# Patient Record
Sex: Female | Born: 1980 | Race: White | Hispanic: Yes | Marital: Single | Smoking: Never smoker
Health system: Southern US, Community
[De-identification: ages and names within clinical notes are randomized; demographics above are authoritative.]

## PROBLEM LIST (undated history)

## (undated) ENCOUNTER — Inpatient Hospital Stay: Payer: Self-pay

## (undated) DIAGNOSIS — J45909 Unspecified asthma, uncomplicated: Secondary | ICD-10-CM

## (undated) DIAGNOSIS — O09529 Supervision of elderly multigravida, unspecified trimester: Secondary | ICD-10-CM

## (undated) HISTORY — PX: NO PAST SURGERIES: SHX2092

---

## 2004-09-20 ENCOUNTER — Inpatient Hospital Stay: Payer: Self-pay | Admitting: Obstetrics and Gynecology

## 2008-06-15 ENCOUNTER — Emergency Department: Payer: Self-pay | Admitting: Emergency Medicine

## 2008-09-22 ENCOUNTER — Ambulatory Visit: Payer: Self-pay | Admitting: Internal Medicine

## 2011-08-09 ENCOUNTER — Emergency Department: Payer: Self-pay | Admitting: Emergency Medicine

## 2013-10-03 ENCOUNTER — Emergency Department: Payer: Self-pay | Admitting: Emergency Medicine

## 2013-10-03 LAB — URINALYSIS, COMPLETE
BACTERIA: NONE SEEN
BILIRUBIN, UR: NEGATIVE
Blood: NEGATIVE
Glucose,UR: NEGATIVE mg/dL (ref 0–75)
Hyaline Cast: 1
Nitrite: NEGATIVE
Ph: 8 (ref 4.5–8.0)
RBC,UR: 4 /HPF (ref 0–5)
Specific Gravity: 1.026 (ref 1.003–1.030)
Squamous Epithelial: 22
WBC UR: 22 /HPF (ref 0–5)

## 2013-10-03 LAB — CBC
HCT: 38.4 % (ref 35.0–47.0)
HGB: 13.4 g/dL (ref 12.0–16.0)
MCH: 31.2 pg (ref 26.0–34.0)
MCHC: 34.9 g/dL (ref 32.0–36.0)
MCV: 89 fL (ref 80–100)
PLATELETS: 210 10*3/uL (ref 150–440)
RBC: 4.3 10*6/uL (ref 3.80–5.20)
RDW: 12.5 % (ref 11.5–14.5)
WBC: 6.1 10*3/uL (ref 3.6–11.0)

## 2013-10-03 LAB — BASIC METABOLIC PANEL
ANION GAP: 6 — AB (ref 7–16)
BUN: 6 mg/dL — ABNORMAL LOW (ref 7–18)
CO2: 30 mmol/L (ref 21–32)
Calcium, Total: 8.4 mg/dL — ABNORMAL LOW (ref 8.5–10.1)
Chloride: 101 mmol/L (ref 98–107)
Creatinine: 0.56 mg/dL — ABNORMAL LOW (ref 0.60–1.30)
Glucose: 89 mg/dL (ref 65–99)
Osmolality: 271 (ref 275–301)
POTASSIUM: 3.6 mmol/L (ref 3.5–5.1)
SODIUM: 137 mmol/L (ref 136–145)

## 2013-10-03 LAB — TROPONIN I: Troponin-I: <0.02 ng/mL

## 2015-08-15 DIAGNOSIS — O09529 Supervision of elderly multigravida, unspecified trimester: Secondary | ICD-10-CM

## 2015-08-15 HISTORY — DX: Supervision of elderly multigravida, unspecified trimester: O09.529

## 2016-01-25 LAB — OB RESULTS CONSOLE VARICELLA ZOSTER ANTIBODY, IGG: VARICELLA IGG: IMMUNE

## 2016-01-25 LAB — OB RESULTS CONSOLE RUBELLA ANTIBODY, IGM: Rubella: IMMUNE

## 2016-01-25 LAB — OB RESULTS CONSOLE HEPATITIS B SURFACE ANTIGEN: HEP B S AG: NEGATIVE

## 2016-01-25 LAB — OB RESULTS CONSOLE ANTIBODY SCREEN: ANTIBODY SCREEN: NEGATIVE

## 2016-01-25 LAB — OB RESULTS CONSOLE ABO/RH: RH Type: POSITIVE

## 2016-01-25 LAB — OB RESULTS CONSOLE RPR: RPR: NONREACTIVE

## 2016-01-25 LAB — OB RESULTS CONSOLE HIV ANTIBODY (ROUTINE TESTING): HIV: NONREACTIVE

## 2016-02-21 ENCOUNTER — Other Ambulatory Visit: Payer: Self-pay | Admitting: Family Medicine

## 2016-02-21 ENCOUNTER — Telehealth: Payer: Self-pay | Admitting: Obstetrics and Gynecology

## 2016-02-21 DIAGNOSIS — Z3481 Encounter for supervision of other normal pregnancy, first trimester: Secondary | ICD-10-CM

## 2016-02-21 DIAGNOSIS — O26849 Uterine size-date discrepancy, unspecified trimester: Secondary | ICD-10-CM

## 2016-02-22 ENCOUNTER — Ambulatory Visit
Admission: RE | Admit: 2016-02-22 | Discharge: 2016-02-22 | Disposition: A | Discharge status: Home / Self Care | Payer: 59 | Source: Ambulatory Visit | Attending: Family Medicine | Admitting: Family Medicine

## 2016-02-22 DIAGNOSIS — O26842 Uterine size-date discrepancy, second trimester: Secondary | ICD-10-CM | POA: Insufficient documentation

## 2016-02-22 DIAGNOSIS — O26849 Uterine size-date discrepancy, unspecified trimester: Secondary | ICD-10-CM

## 2016-02-22 DIAGNOSIS — Z3A15 15 weeks gestation of pregnancy: Secondary | ICD-10-CM | POA: Diagnosis not present

## 2016-02-22 DIAGNOSIS — Z3481 Encounter for supervision of other normal pregnancy, first trimester: Secondary | ICD-10-CM

## 2016-02-24 ENCOUNTER — Ambulatory Visit
Admission: RE | Admit: 2016-02-24 | Discharge: 2016-02-24 | Disposition: A | Discharge status: Home / Self Care | Payer: 59 | Source: Ambulatory Visit | Attending: Maternal & Fetal Medicine | Admitting: Maternal & Fetal Medicine

## 2016-02-24 VITALS — BP 115/67 | HR 81 | Temp 98.2°F | Ht 62.0 in | Wt 165.0 lb

## 2016-02-24 DIAGNOSIS — Z3A00 Weeks of gestation of pregnancy not specified: Secondary | ICD-10-CM | POA: Diagnosis not present

## 2016-02-24 DIAGNOSIS — O09512 Supervision of elderly primigravida, second trimester: Secondary | ICD-10-CM

## 2016-02-24 DIAGNOSIS — Z1379 Encounter for other screening for genetic and chromosomal anomalies: Secondary | ICD-10-CM | POA: Diagnosis not present

## 2016-02-24 HISTORY — DX: Supervision of elderly multigravida, unspecified trimester: O09.529

## 2016-02-24 NOTE — Progress Notes (Signed)
Referring Provider:   [facility] Length of Consultation: 60 minutes  Ms. Katie Tate was [facility] for genetic counseling because of advanced maternal age.  The patient will be 35 years old at the time of delivery.  This note summarizes the information we discussed with the aid of a Spanish interpreter.    We explained that the chance of a chromosome abnormality increases with maternal age.  Chromosomes and examples of chromosome problems were reviewed.  Humans typically have 46 chromosomes in each cell, with half passed through each sperm and egg.  Any change in the number or structure of chromosomes can increase the risk of problems in the physical and mental development of a pregnancy.   Based upon age of the patient, the chance of any chromosome abnormality was 1 in 98141. The chance of Down syndrome, the most common chromosome problem associated with maternal age, was 1 in 13256.  The risk of chromosome problems is in addition to the 3% general population risk for birth defects and mental retardation.  The greatest chance, of course, is that the baby would be born in good health.  We discussed the following prenatal screening and testing options for this pregnancy:  Maternal serum marker screening, a blood test that measures pregnancy proteins, can provide risk assessments for Down syndrome, trisomy 18, and open neural tube defects (spina bifida, anencephaly). Because it does not directly examine the fetus, it cannot positively diagnose or rule out these problems.  Targeted ultrasound uses high frequency sound waves to create an image of the developing fetus.  An ultrasound is often recommended as a routine means of evaluating the pregnancy.  It is also used to screen for fetal anatomy problems (for example, a heart defect) that might be suggestive of a chromosomal or other abnormality.   Amniocentesis involves the removal of a small amount  of amniotic fluid from the sac surrounding the fetus with the use of a thin needle inserted through the maternal abdomen and uterus.  Ultrasound guidance is used throughout the procedure.  Fetal cells from amniotic fluid are directly evaluated and > 99.5% of chromosome problems and > 98% of open neural tube defects can be detected. This procedure is generally performed after the 15th week of pregnancy.  The main risks to this procedure include complications leading to miscarriage in less than 1 in 200 cases (0.5%).  We also reviewed the availability of cell free fetal DNA testing from maternal blood to determine whether or not the baby may have either Down syndrome, trisomy 4813, or trisomy 3818.  This test utilizes a maternal blood sample and DNA sequencing technology to isolate circulating cell free fetal DNA from maternal plasma.  The fetal DNA can then be analyzed for DNA sequences that are derived from the three most common chromosomes involved in aneuploidy, chromosomes 13, 18, and 21.  If the overall amount of DNA is greater than the expected level for any of these chromosomes, aneuploidy is suspected.  While we do not consider it a replacement for invasive testing and karyotype analysis, a negative result from this testing would be reassuring, though not a guarantee of a normal chromosome complement for the baby.  An abnormal result is certainly suggestive of an abnormal chromosome complement, though we would still recommend amniocentesis to confirm any findings from this testing.  Cystic Fibrosis and Spinal Muscular Atrophy (SMA) screening were also discussed with the patient. Both conditions are recessive, which means  that both parents must be carriers in order to have a child with the disease.  Cystic fibrosis (CF) is one of the most common genetic conditions in persons of Caucasian ancestry.  This condition occurs in approximately 1 in 2,500 Caucasian persons and results in thickened secretions in the  lungs, digestive, and reproductive systems.  For a baby to be at risk for having CF, both of the parents must be carriers for this condition.  Approximately 1 in 47 Caucasian persons is a carrier for CF.  Current carrier testing looks for the most common mutations in the gene for CF and can detect approximately 90% of carriers in the Caucasian population.  This means that the carrier screening can greatly reduce, but cannot eliminate, the chance for an individual to have a child with CF.  If an individual is found to be a carrier for CF, then carrier testing would be available for the partner. As part of [place]'s newborn screening profile, all babies born in the state of [place] will have a two-tier screening process.  Specimens are first tested to determine the concentration of immunoreactive trypsinogen (IRT).  The top 5% of specimens with the highest IRT values then undergo DNA testing using a panel of over 40 common CF mutations. SMA is a neurodegenerative disorder that leads to atrophy of skeletal muscle and overall weakness.  This condition is also more prevalent in the Caucasian population, with 1 in 40-1 in 60 persons being a carrier and 1 in 6,000-1 in 10,000 children being affected.  There are multiple forms of the disease, with some causing death in infancy to other forms with survival into adulthood.  The genetics of SMA is complex, but carrier screening can detect up to 95% of carriers in the Caucasian population.  Similar to CF, a negative result can greatly reduce, but cannot eliminate, the chance to have a child with SMA.  The chance for these conditions is less in persons of Hispanic ancestry.  We obtained a detailed family history and pregnancy history.  The patient stated that her children, ages 57 and 60, both have hypertension, as does her father.  We reviewed that high blood pressure is most often the result of a combination of lifestyle factors and genetic factors.  The  specific genetic factors are not well understood.  We encourage the family to be mindful of diet, exercise and lifestyle modifications to help reduce blood pressure.  The remainder of the family history is unremarkable for birth defects, mental retardation, recurrent pregnancy loss or known chromosome abnormalities.  Ms. Rowan Pollman stated that this is her third pregnancy with her husband.  She reported no complications or exposures in this pregnancy that would be expected to increase the risk for birth defects.  After consideration of the options, Ms. Leiloni Smithers elected to proceed with InformaSeq testing and CF carrier screening.  After checking with insurance, we learned that they could not guarantee coverage for SMA carrier screening prior to testing.  The patient therefore declined that screening test.  The patient was scheduled to return in 3 weeks for a detailed anatomy ultrasound.  Ms. Maeby Vankleeck was encouraged to call with questions or concerns.  We can be contacted at [phone].   Cherly Anderson, MS, CGC

## 2016-02-24 NOTE — Progress Notes (Signed)
History reviewed.  Agree with recommendations outlined in [facility]'s note.

## 2016-03-01 LAB — CYSTIC FIBROSIS GENE TEST

## 2016-03-02 LAB — INFORMASEQ(SM) WITH XY ANALYSIS
FETAL FRACTION (%): 7.1
Fetal Number: 1
GESTATIONAL AGE AT COLLECTION: 15.3 wk
Weight: 165 [lb_av]

## 2016-03-06 ENCOUNTER — Telehealth: Payer: Self-pay | Admitting: Obstetrics and Gynecology

## 2016-03-06 NOTE — Telephone Encounter (Signed)
Ms. Katie Tate elected to have cell free fetal DNA testing through InformaSeq and CF carrier screening on 02/24/2016.  Results were communicated to the patient today with the aid of a Spanish interpreter.    The patient was informed of the results of her recent InformaSeq testing (performed at [facility]) which yielded NEGATIVE results.  The patient's specimen showed DNA consistent with two copies of chromosomes 21, 18 and 13.  The sensitivity for trisomy 73, trisomy 46 and trisomy 84 using this testing are reported as 99.1%, 98.3% and 98.1% respectively.  Thus, while the results of this testing are highly accurate, they are not considered diagnostic at this time.  Should more definitive information be desired, the patient may still consider amniocentesis.   As requested to know by the patient, sex chromosome analysis was included for this sample.  Results was consistent with a female fetus (XY). This is predicted with >97% accuracy.  A maternal serum AFP only should be considered if screening for neural tube defects is desired.  In addition, CF results are available.  CF is a genetic condition that occurs most often in Caucasian persons.  It primarily affects the lungs, digestive, and reproductive systems.  For someone to be at risk for having CF, both of their parents must be carriers for CF.  The testing can detect many persons who are carriers for CF and therefore determine if the pregnancy is at an increased risk for this condition.    The blood test results were negative when examined for the 32 most common mutations (or changes) in the gene for CF.  This means that she does not carry any of the most common changes in this gene.  Testing for these 32 mutations detects approximately 73% of carriers who are Hispanic.  Therefore, the chance that she is a carrier based on this negative result has been reduced from 1 in 46 to approximately 1 in 168.  Because this testing cannot detect all changes that may  cause CF, we cannot eliminate the chance that this individual is a carrier completely.  This dramatically reduces her chance to have a child with CF.  If there are any questions or concerns, please feel free to contact our office at [phone].     Cherly Anderson, MS, CGC

## 2016-03-16 ENCOUNTER — Other Ambulatory Visit: Payer: Self-pay

## 2016-03-16 ENCOUNTER — Ambulatory Visit
Admission: RE | Admit: 2016-03-16 | Discharge: 2016-03-16 | Disposition: A | Discharge status: Home / Self Care | Payer: 59 | Source: Ambulatory Visit | Attending: Obstetrics & Gynecology | Admitting: Obstetrics & Gynecology

## 2016-03-16 DIAGNOSIS — Z3A18 18 weeks gestation of pregnancy: Secondary | ICD-10-CM | POA: Diagnosis not present

## 2016-03-16 DIAGNOSIS — O09512 Supervision of elderly primigravida, second trimester: Secondary | ICD-10-CM

## 2016-03-16 DIAGNOSIS — O09522 Supervision of elderly multigravida, second trimester: Secondary | ICD-10-CM

## 2016-03-27 NOTE — Progress Notes (Signed)
Agree with assessment and plan as outlined in [facility] Wells's note.   

## 2016-04-05 ENCOUNTER — Other Ambulatory Visit: Payer: Self-pay

## 2016-04-05 DIAGNOSIS — O09522 Supervision of elderly multigravida, second trimester: Secondary | ICD-10-CM

## 2016-04-06 ENCOUNTER — Ambulatory Visit
Admission: RE | Admit: 2016-04-06 | Discharge: 2016-04-06 | Disposition: A | Discharge status: Home / Self Care | Payer: 59 | Source: Ambulatory Visit | Attending: Obstetrics & Gynecology | Admitting: Obstetrics & Gynecology

## 2016-04-06 DIAGNOSIS — O09522 Supervision of elderly multigravida, second trimester: Secondary | ICD-10-CM | POA: Diagnosis present

## 2016-04-06 DIAGNOSIS — O321XX0 Maternal care for breech presentation, not applicable or unspecified: Secondary | ICD-10-CM | POA: Diagnosis not present

## 2016-04-06 DIAGNOSIS — Z3A21 21 weeks gestation of pregnancy: Secondary | ICD-10-CM | POA: Insufficient documentation

## 2016-04-06 DIAGNOSIS — O09512 Supervision of elderly primigravida, second trimester: Secondary | ICD-10-CM

## 2016-04-24 NOTE — Telephone Encounter (Signed)
Note opened in error.

## 2016-07-16 LAB — OB RESULTS CONSOLE GBS: GBS: POSITIVE

## 2016-07-17 LAB — OB RESULTS CONSOLE GC/CHLAMYDIA
Chlamydia: NEGATIVE
Gonorrhea: NEGATIVE

## 2016-08-14 NOTE — L&D Delivery Note (Addendum)
VAGINAL DELIVERY NOTE:  Date of Delivery: 08/22/2016 [facility] Gestational Age/EDD: 8076w0d 08/15/2016, by Last Menstrual Period Antepartum complications: GBS positive, obesity, varicella non-immune, anemia, dental caries, asthma, AMA  Attending Physician: M.Teagyn Fishel, CNM/Harris Delivery Type:NSVD Anesthesia:Epidural for repair  Laceration: 1st degree  Episiotomy: None Placenta: SDOP intact - meconium stained  Intrapartum complications: Recurrent variable decels throughout the night, with recovery with IVF bolus, oxygen by facemask, and terbutaline.  Decreased variability with recovery after O2, elevated BPs, proteinuria 240 Estimated Blood Loss: 350mL GBS: Pos with antibiotics throughout labor  Procedure Details: Pusing with UC's and vtx delivered, NO CAN noted, Ant and post shoulder and body delivered at 0814 and to mom's abd, terminal meconium noted, delayed cord clamping, CCx2 and cut. SDOP intact at 0819. FF and lochia mod. 1st degree repair done with 2.0 and 3.0 vicryl    Baby: Liveborn Female, Apgars 8,9, weight pending, baby named "Christian"    [place]eredith Hassan [place], [place]M

## 2016-08-16 ENCOUNTER — Encounter: Payer: Self-pay | Admitting: *Deleted

## 2016-08-16 ENCOUNTER — Observation Stay
Admission: EM | Admit: 2016-08-16 | Discharge: 2016-08-16 | Disposition: A | Discharge status: Home / Self Care | Payer: Managed Care, Other (non HMO) | Attending: Obstetrics and Gynecology | Admitting: Obstetrics and Gynecology

## 2016-08-16 DIAGNOSIS — O479 False labor, unspecified: Secondary | ICD-10-CM | POA: Diagnosis present

## 2016-08-16 DIAGNOSIS — Z3483 Encounter for supervision of other normal pregnancy, third trimester: Principal | ICD-10-CM | POA: Insufficient documentation

## 2016-08-16 DIAGNOSIS — Z3A40 40 weeks gestation of pregnancy: Secondary | ICD-10-CM | POA: Insufficient documentation

## 2016-08-16 DIAGNOSIS — O09512 Supervision of elderly primigravida, second trimester: Secondary | ICD-10-CM

## 2016-08-16 LAB — COMPREHENSIVE METABOLIC PANEL
ALK PHOS: 193 U/L — AB (ref 38–126)
ALT: 20 U/L (ref 14–54)
AST: 18 U/L (ref 15–41)
Albumin: 2.8 g/dL — ABNORMAL LOW (ref 3.5–5.0)
Anion gap: 7 (ref 5–15)
BUN: 9 mg/dL (ref 6–20)
CALCIUM: 8.6 mg/dL — AB (ref 8.9–10.3)
CO2: 22 mmol/L (ref 22–32)
CREATININE: 0.58 mg/dL (ref 0.44–1.00)
Chloride: 106 mmol/L (ref 101–111)
GFR calc non Af Amer: >60 mL/min (ref >60)
GLUCOSE: 80 mg/dL (ref 65–99)
Potassium: 3.9 mmol/L (ref 3.5–5.1)
SODIUM: 135 mmol/L (ref 135–145)
Total Bilirubin: 0.7 mg/dL (ref 0.3–1.2)
Total Protein: 6.1 g/dL — ABNORMAL LOW (ref 6.5–8.1)

## 2016-08-16 LAB — CBC
HCT: 34 % — ABNORMAL LOW (ref 35.0–47.0)
Hemoglobin: 11.2 g/dL — ABNORMAL LOW (ref 12.0–16.0)
MCH: 27.6 pg (ref 26.0–34.0)
MCHC: 32.9 g/dL (ref 32.0–36.0)
MCV: 83.9 fL (ref 80.0–100.0)
PLATELETS: 226 10*3/uL (ref 150–440)
RBC: 4.06 MIL/uL (ref 3.80–5.20)
RDW: 15.1 % — AB (ref 11.5–14.5)
WBC: 6.8 10*3/uL (ref 3.6–11.0)

## 2016-08-16 LAB — PROTEIN / CREATININE RATIO, URINE
Creatinine, Urine: 182 mg/dL
Protein Creatinine Ratio: 0.22 mg/mg{Cre} — ABNORMAL HIGH (ref 0.00–0.15)
Total Protein, Urine: 40 mg/dL

## 2016-08-16 MED ORDER — LABETALOL HCL 5 MG/ML IV SOLN: 20.0000 mg | INTRAVENOUS | Status: DC | PRN

## 2016-08-16 MED ORDER — HYDRALAZINE HCL 20 MG/ML IJ SOLN
10.0000 mg | Freq: Once | INTRAMUSCULAR | Status: DC | PRN
  Filled 2016-08-16: qty 1

## 2016-08-16 NOTE — Progress Notes (Signed)
Call lab to check status of PCR - "give it a few more minutes".

## 2016-08-16 NOTE — Progress Notes (Signed)
Clean catch urine collected and sent to lab for PCR. CBC, CMP collected via left AC, sent to lab. Pain evaluation: 7/10, pain on lower right side radiating to lower back (right side), with contractions.

## 2016-08-16 NOTE — Progress Notes (Signed)
Patient ID: Katie Tate, female   DOB: 11/05/1980, 36 y.o.   MRN: [id number] Katie Tate 03/21/1981 G3 P2 6861w1d presents for uterine ctx noLOF , no vaginal bleeding , O;BP (!) 147/88 (BP Location: Left Arm)   Pulse 78   Temp 98.1 F (36.7 C) (Oral)   Resp 17   Ht 5\' 3"  (1.6 m)   Wt 181 lb (82.1 kg)   LMP 11/09/2015   BMI 32.06 kg/m  ABDsoft NT  CX 1 cm  NSTreactive , irregular ctx Labs: PIH labs wnl  A: initial elevation in bp , no PIH .  Not in labor  P:d/c home with precautions

## 2016-08-16 NOTE — Discharge Summary (Signed)
  Progress Notes Date of Service: 08/16/2016 10:54 AM Suzy Bouchardhomas J Schermerhorn, MD  Obstetrics    [] Hide copied text [] Hover for attribution information Patient ID: Katie Tate, female   DOB: 07/09/1981, 36 y.o.   MRN: [id number] Katie Tate 07/01/1981 G3 P2 2369w1d presents for uterine ctx noLOF , no vaginal bleeding , O;BP (!) 147/88 (BP Location: Left Arm)   Pulse 78   Temp 98.1 F (36.7 C) (Oral)   Resp 17   Ht 5\' 3"  (1.6 m)   Wt 181 lb (82.1 kg)   LMP 11/09/2015   BMI 32.06 kg/m  ABDsoft NT  CX 1 cm  NSTreactive , irregular ctx Labs: PIH labs wnl  A: initial elevation in bp , no PIH .  Not in labor  P:d/c home with precautions     Electronically signed by Suzy Bouchardhomas J Schermerhorn, MD at 08/16/2016 1:38 PM

## 2016-08-16 NOTE — OB Triage Note (Addendum)
Patient started contracting yesterday around 11:00 p.m.; contractions are stronger, pt. Here to be evaluated.  Pt. Denies suddent gush of fluid or vaginal bleeding. Positivie for fetal movement.  EFM applied - FHR 130s, Toco applied - abd. Soft.

## 2016-08-21 ENCOUNTER — Inpatient Hospital Stay
Admission: EM | Admit: 2016-08-21 | Discharge: 2016-08-24 | DRG: 775 | Disposition: A | Discharge status: Home / Self Care | Payer: Managed Care, Other (non HMO) | Attending: Obstetrics and Gynecology | Admitting: Obstetrics and Gynecology

## 2016-08-21 DIAGNOSIS — O09512 Supervision of elderly primigravida, second trimester: Secondary | ICD-10-CM

## 2016-08-21 DIAGNOSIS — Z833 Family history of diabetes mellitus: Secondary | ICD-10-CM

## 2016-08-21 DIAGNOSIS — D62 Acute posthemorrhagic anemia: Secondary | ICD-10-CM | POA: Diagnosis not present

## 2016-08-21 DIAGNOSIS — O9081 Anemia of the puerperium: Secondary | ICD-10-CM | POA: Diagnosis not present

## 2016-08-21 DIAGNOSIS — E669 Obesity, unspecified: Secondary | ICD-10-CM | POA: Diagnosis present

## 2016-08-21 DIAGNOSIS — O480 Post-term pregnancy: Secondary | ICD-10-CM | POA: Diagnosis present

## 2016-08-21 DIAGNOSIS — Z8249 Family history of ischemic heart disease and other diseases of the circulatory system: Secondary | ICD-10-CM

## 2016-08-21 DIAGNOSIS — O99824 Streptococcus B carrier state complicating childbirth: Secondary | ICD-10-CM | POA: Diagnosis present

## 2016-08-21 DIAGNOSIS — Z23 Encounter for immunization: Secondary | ICD-10-CM | POA: Diagnosis not present

## 2016-08-21 DIAGNOSIS — O9952 Diseases of the respiratory system complicating childbirth: Secondary | ICD-10-CM | POA: Diagnosis present

## 2016-08-21 DIAGNOSIS — O99214 Obesity complicating childbirth: Secondary | ICD-10-CM | POA: Diagnosis present

## 2016-08-21 DIAGNOSIS — Z3A40 40 weeks gestation of pregnancy: Secondary | ICD-10-CM | POA: Diagnosis not present

## 2016-08-21 DIAGNOSIS — Z6832 Body mass index (BMI) 32.0-32.9, adult: Secondary | ICD-10-CM | POA: Diagnosis not present

## 2016-08-21 DIAGNOSIS — J45909 Unspecified asthma, uncomplicated: Secondary | ICD-10-CM | POA: Diagnosis present

## 2016-08-21 LAB — TYPE AND SCREEN
ABO/RH(D): A POS
Antibody Screen: NEGATIVE

## 2016-08-21 LAB — CBC
HCT: 34.6 % — ABNORMAL LOW (ref 35.0–47.0)
HEMOGLOBIN: 11.5 g/dL — AB (ref 12.0–16.0)
MCH: 28 pg (ref 26.0–34.0)
MCHC: 33.3 g/dL (ref 32.0–36.0)
MCV: 84.1 fL (ref 80.0–100.0)
PLATELETS: 243 10*3/uL (ref 150–440)
RBC: 4.12 MIL/uL (ref 3.80–5.20)
RDW: 15.8 % — ABNORMAL HIGH (ref 11.5–14.5)
WBC: 7.1 10*3/uL (ref 3.6–11.0)

## 2016-08-21 MED ORDER — LACTATED RINGERS IV SOLN: 500.0000 mL | INTRAVENOUS | Status: DC | PRN

## 2016-08-21 MED ORDER — OXYTOCIN 40 UNITS IN LACTATED RINGERS INFUSION - SIMPLE MED
2.5000 [IU]/h | INTRAVENOUS | Status: DC
  Filled 2016-08-21: qty 1000

## 2016-08-21 MED ORDER — SOD CITRATE-CITRIC ACID 500-334 MG/5ML PO SOLN: 30.0000 mL | ORAL | Status: DC | PRN

## 2016-08-21 MED ORDER — ACETAMINOPHEN 325 MG PO TABS
650.0000 mg | ORAL_TABLET | ORAL | Status: DC | PRN
  Filled 2016-08-21: qty 2

## 2016-08-21 MED ORDER — OXYTOCIN BOLUS FROM INFUSION
500.0000 mL | Freq: Once | INTRAVENOUS | Status: AC
  Administered 2016-08-22: 500 mL via INTRAVENOUS

## 2016-08-21 MED ORDER — PENICILLIN G POT IN DEXTROSE 60000 UNIT/ML IV SOLN
3.0000 10*6.[IU] | INTRAVENOUS | Status: DC
  Administered 2016-08-22: 3 10*6.[IU] via INTRAVENOUS
  Filled 2016-08-21 (×7): qty 50

## 2016-08-21 MED ORDER — BUTORPHANOL TARTRATE 1 MG/ML IJ SOLN
1.0000 mg | INTRAMUSCULAR | Status: DC | PRN
  Administered 2016-08-22: 1 mg via INTRAVENOUS
  Filled 2016-08-21: qty 1

## 2016-08-21 MED ORDER — DINOPROSTONE 10 MG VA INST
10.0000 mg | VAGINAL_INSERT | Freq: Once | VAGINAL | Status: AC
  Administered 2016-08-21: 10 mg via VAGINAL
  Filled 2016-08-21: qty 1

## 2016-08-21 MED ORDER — PENICILLIN G POTASSIUM 5000000 UNITS IJ SOLR
5.0000 10*6.[IU] | Freq: Once | INTRAMUSCULAR | Status: AC
  Administered 2016-08-21: 5 10*6.[IU] via INTRAVENOUS
  Filled 2016-08-21: qty 5

## 2016-08-21 MED ORDER — ONDANSETRON HCL 4 MG/2ML IJ SOLN: 4.0000 mg | Freq: Four times a day (QID) | INTRAMUSCULAR | Status: DC | PRN

## 2016-08-21 MED ORDER — LIDOCAINE HCL (PF) 1 % IJ SOLN
30.0000 mL | INTRAMUSCULAR | Status: AC | PRN
  Administered 2016-08-22: 1.8 mL via SUBCUTANEOUS

## 2016-08-21 MED ORDER — LACTATED RINGERS IV SOLN
INTRAVENOUS | Status: DC
  Administered 2016-08-21: 20:00:00 via INTRAVENOUS

## 2016-08-21 MED ORDER — TERBUTALINE SULFATE 1 MG/ML IJ SOLN
0.2500 mg | Freq: Once | INTRAMUSCULAR | Status: AC | PRN
  Administered 2016-08-22: 0.25 mg via SUBCUTANEOUS
  Filled 2016-08-21: qty 1

## 2016-08-21 NOTE — H&P (Signed)
OB ADMISSION/ HISTORY & PHYSICAL:  Admission Date: 08/21/2016  7:34 PM  Admit Diagnosis: Induction of labor for postdates at 40+6 weeks   Katie Tate is a 36 y.o. female G3P2 at 40+6 weeks presenting for induction of labor for postdates.    Prenatal History: [id number]   EDC : 08/15/2016, by Last Menstrual Period 11/09/15 [facility] care at [facility]  Prenatal course complicated by GBS positive, obesity, varicella non-immune, anemia, dental caries, asthma, AMA  Prenatal Labs: ABO, Rh: A/Positive/-- (06/13 0000) Antibody: Negative (06/13 0000) Rubella: Immune (06/13 0000)  RPR: Nonreactive (06/13 0000)  HBsAg: Negative (06/13 0000)  HIV: Non-reactive (06/13 0000)  Varicella: Non-immune GTT: 91 GBS: Positive (12/03 0000)   Flu vaccine: 05/04/16 Tdap: 06/01/16  Ob hx:  NSVD - 05/27/1999: female 6#5oz NSVD - 09/20/2004: female 6#13oz   Medical / Surgical History :  Past medical history:  Past Medical History:  Diagnosis Date  . Advanced maternal age in multigravida 2017     Past surgical history:  Past Surgical History:  Procedure Laterality Date  . NO PAST SURGERIES      Family History:  Family History  Problem Relation Age of Onset  . Diabetes Mother   . Hypertension Father      Social History:  reports that she has never smoked. She has never used smokeless tobacco. She reports that she does not drink alcohol or use drugs.   Allergies: Patient has no known allergies.    Current Medications at time of admission:  Prior to Admission medications   Medication Sig Start Date End Date Taking? Authorizing Provider  doxylamine, Sleep, (UNISOM) 25 MG tablet Take 25 mg by mouth at bedtime as needed.    Historical Provider, MD  Prenatal Vit-Fe Fumarate-FA (PRENATAL MULTIVITAMIN) TABS tablet Take 1 tablet by mouth daily at 12 noon.    Historical Provider, MD  vitamin B-6 (PYRIDOXINE) 25 MG tablet Take 25 mg by mouth every 8 (eight) hours as needed.    Historical  Provider, MD     Review of Systems: Active FM Irregular BH ctxs No LOF  / SROM  No bloody show    Physical Exam:  VS: Last menstrual period 11/09/2015.  General: alert and oriented, appears calm Heart: RRR Lungs: Clear lung fields Abdomen: Gravid, soft and non-tender, non-distended / uterus: gravid, non-tender Extremities: no edema  Genitalia / VE: Dilation: 1 Effacement (%): 30 Station: -2 Exam by:: ALogan Bores, RN  FHR: baseline rate 150 bpm / variability moderate / accelerations + / occasional early and variable decelerations TOCO: every 6-10 minutes   Assessment: 40+[redacted] weeks gestation Induction stage of labor FHR category 2   Plan:  1. Admit to [employer] for Induction of labor for postdates      - Bishop score: 5     - Cervidil 10mg  vaginally x 1      - Routine Labor and Delivery orders     - Stadol 1mg  IVP every 1 hour PRN for pain      - Epidural upon request  2. GBS Positive     - PCN IVPB 5 million units x 1 dose, then 3 million units IVPB every 4 hours  3. Postpartum      - Plans breastfeeding     - Varicella Non-immune: offer vaccine PP     - Contraception: Paragard IUD 4. Anticipate NSVD      - Proven Pelvis: 6#13oz   Dr. Tiburcio Pea notified of admission / plan of care  Katie Tate, CNM

## 2016-08-22 ENCOUNTER — Inpatient Hospital Stay: Payer: Managed Care, Other (non HMO) | Admitting: Anesthesiology

## 2016-08-22 LAB — PROTEIN / CREATININE RATIO, URINE
CREATININE, URINE: 186 mg/dL
Protein Creatinine Ratio: 0.24 mg/mg{Cre} — ABNORMAL HIGH (ref 0.00–0.15)
Total Protein, Urine: 44 mg/dL

## 2016-08-22 MED ORDER — FLEET ENEMA 7-19 GM/118ML RE ENEM: 1.0000 | ENEMA | Freq: Every day | RECTAL | Status: DC | PRN

## 2016-08-22 MED ORDER — FENTANYL 2.5 MCG/ML W/ROPIVACAINE 0.2% IN NS 100 ML EPIDURAL INFUSION (ARMC-ANES): 10.0000 mL/h | EPIDURAL | Status: DC

## 2016-08-22 MED ORDER — DIPHENHYDRAMINE HCL 25 MG PO CAPS: 25.0000 mg | ORAL_CAPSULE | Freq: Four times a day (QID) | ORAL | Status: DC | PRN

## 2016-08-22 MED ORDER — SODIUM CHLORIDE 0.9% FLUSH: 3.0000 mL | Freq: Two times a day (BID) | INTRAVENOUS | Status: DC

## 2016-08-22 MED ORDER — SENNOSIDES-DOCUSATE SODIUM 8.6-50 MG PO TABS
2.0000 | ORAL_TABLET | ORAL | Status: DC
  Administered 2016-08-23 – 2016-08-24 (×2): 2 via ORAL
  Filled 2016-08-22 (×2): qty 2

## 2016-08-22 MED ORDER — TETANUS-DIPHTH-ACELL PERTUSSIS 5-2.5-18.5 LF-MCG/0.5 IM SUSP: 0.5000 mL | Freq: Once | INTRAMUSCULAR | Status: DC

## 2016-08-22 MED ORDER — PHENYLEPHRINE 40 MCG/ML (10ML) SYRINGE FOR IV PUSH (FOR BLOOD PRESSURE SUPPORT)
80.0000 ug | PREFILLED_SYRINGE | INTRAVENOUS | Status: DC | PRN
  Filled 2016-08-22: qty 5

## 2016-08-22 MED ORDER — LIDOCAINE HCL (PF) 1 % IJ SOLN
INTRAMUSCULAR | Status: AC
  Filled 2016-08-22: qty 30

## 2016-08-22 MED ORDER — ONDANSETRON HCL 4 MG/2ML IJ SOLN: 4.0000 mg | INTRAMUSCULAR | Status: DC | PRN

## 2016-08-22 MED ORDER — LIDOCAINE-EPINEPHRINE (PF) 1.5 %-1:200000 IJ SOLN
INTRAMUSCULAR | Status: DC | PRN
  Administered 2016-08-22: 3 mL via EPIDURAL

## 2016-08-22 MED ORDER — SIMETHICONE 80 MG PO CHEW: 80.0000 mg | CHEWABLE_TABLET | ORAL | Status: DC | PRN

## 2016-08-22 MED ORDER — SODIUM CHLORIDE 0.9 % IV SOLN: 250.0000 mL | INTRAVENOUS | Status: DC | PRN

## 2016-08-22 MED ORDER — MISOPROSTOL 200 MCG PO TABS
ORAL_TABLET | ORAL | Status: AC
  Filled 2016-08-22: qty 4

## 2016-08-22 MED ORDER — DIPHENHYDRAMINE HCL 50 MG/ML IJ SOLN: 12.5000 mg | INTRAMUSCULAR | Status: DC | PRN

## 2016-08-22 MED ORDER — BISACODYL 10 MG RE SUPP: 10.0000 mg | Freq: Every day | RECTAL | Status: DC | PRN

## 2016-08-22 MED ORDER — IBUPROFEN 600 MG PO TABS
600.0000 mg | ORAL_TABLET | Freq: Four times a day (QID) | ORAL | Status: DC
  Administered 2016-08-22 – 2016-08-24 (×9): 600 mg via ORAL
  Filled 2016-08-22 (×9): qty 1

## 2016-08-22 MED ORDER — ZOLPIDEM TARTRATE 5 MG PO TABS
5.0000 mg | ORAL_TABLET | Freq: Every evening | ORAL | Status: DC | PRN
Start: 2016-08-22 — End: 2016-08-24

## 2016-08-22 MED ORDER — EPHEDRINE 5 MG/ML INJ
10.0000 mg | INTRAVENOUS | Status: DC | PRN
  Filled 2016-08-22: qty 2

## 2016-08-22 MED ORDER — COCONUT OIL OIL
1.0000 "application " | TOPICAL_OIL | Status: DC | PRN
  Filled 2016-08-22: qty 120

## 2016-08-22 MED ORDER — ACETAMINOPHEN 325 MG PO TABS: 650.0000 mg | ORAL_TABLET | ORAL | Status: DC | PRN

## 2016-08-22 MED ORDER — LACTATED RINGERS IV SOLN: 500.0000 mL | Freq: Once | INTRAVENOUS | Status: DC

## 2016-08-22 MED ORDER — DIBUCAINE 1 % RE OINT: 1.0000 "application " | TOPICAL_OINTMENT | RECTAL | Status: DC | PRN

## 2016-08-22 MED ORDER — MEASLES, MUMPS & RUBELLA VAC ~~LOC~~ INJ
0.5000 mL | INJECTION | Freq: Once | SUBCUTANEOUS | Status: DC
  Filled 2016-08-22: qty 0.5

## 2016-08-22 MED ORDER — SENNOSIDES-DOCUSATE SODIUM 8.6-50 MG PO TABS: 2.0000 | ORAL_TABLET | ORAL | Status: DC

## 2016-08-22 MED ORDER — WITCH HAZEL-GLYCERIN EX PADS: 1.0000 "application " | MEDICATED_PAD | CUTANEOUS | Status: DC | PRN

## 2016-08-22 MED ORDER — FENTANYL 2.5 MCG/ML W/ROPIVACAINE 0.2% IN NS 100 ML EPIDURAL INFUSION (ARMC-ANES)
10.0000 mL/h | EPIDURAL | Status: DC
  Administered 2016-08-22: 10 mL/h via EPIDURAL
  Filled 2016-08-22: qty 100

## 2016-08-22 MED ORDER — ONDANSETRON HCL 4 MG PO TABS: 4.0000 mg | ORAL_TABLET | ORAL | Status: DC | PRN

## 2016-08-22 MED ORDER — SODIUM CHLORIDE 0.9% FLUSH: 3.0000 mL | INTRAVENOUS | Status: DC | PRN

## 2016-08-22 MED ORDER — BENZOCAINE-MENTHOL 20-0.5 % EX AERO: 1.0000 "application " | INHALATION_SPRAY | CUTANEOUS | Status: DC | PRN

## 2016-08-22 MED ORDER — PRENATAL MULTIVITAMIN CH
1.0000 | ORAL_TABLET | Freq: Every day | ORAL | Status: DC
  Administered 2016-08-22 – 2016-08-24 (×3): 1 via ORAL
  Filled 2016-08-22 (×3): qty 1

## 2016-08-22 NOTE — Anesthesia Preprocedure Evaluation (Signed)
Anesthesia Evaluation  Patient identified by MRN, date of birth, ID band Patient awake    Reviewed: Allergy & Precautions, Patient's Chart, lab work & pertinent test results  History of Anesthesia Complications Negative for: history of anesthetic complications  Airway Mallampati: III       Dental   Pulmonary asthma ,           Cardiovascular negative cardio ROS       Neuro/Psych negative neurological ROS     GI/Hepatic negative GI ROS, Neg liver ROS,   Endo/Other  negative endocrine ROS  Renal/GU negative Renal ROS     Musculoskeletal   Abdominal   Peds  Hematology   Anesthesia Other Findings   Reproductive/Obstetrics                             Anesthesia Physical Anesthesia Plan  ASA: II  Anesthesia Plan: Epidural   Post-op Pain Management:    Induction:   Airway Management Planned:   Additional Equipment:   Intra-op Plan:   Post-operative Plan:   Informed Consent: I have reviewed the patients History and Physical, chart, labs and discussed the procedure including the risks, benefits and alternatives for the proposed anesthesia with the patient or authorized representative who has indicated his/her understanding and acceptance.     Plan Discussed with:   Anesthesia Plan Comments:         Anesthesia Quick Evaluation

## 2016-08-22 NOTE — Progress Notes (Signed)
S:  Notified of recurrent variable deceleration to a nadir of 90 bpm     Cervidil removed by RN as instructed      Pt. Rating pain 9/10  O:  VS: Blood pressure (!) 139/91, pulse 84, temperature 98.1 F (36.7 C), temperature source Oral, resp. rate 18, height 5\' 3"  (1.6 m), weight 82.6 kg (182 lb), last menstrual period 11/09/2015.        FHR : baseline 135 bpm / variability moderate / accelerations occasional / recurrent variable decelerations        Toco: contractions every 1-3 minutes / moderate-strong         Cervix : Dilation: 2 Effacement (%): 40 Station: -2 Presentation: Vertex Exam by:: ALogan Bores. Evans, RN        Membranes: intact  A: Latent labor     FHR category 2  P: Left lateral position      O2 by facemask     Terbutaline 0.25mg  subq x 1    Continuously monitor closely      If it does not improve with these interventions soon, will notify back-up MD  Carlean JewsMeredith Latham Kinzler, CNM

## 2016-08-22 NOTE — Anesthesia Procedure Notes (Signed)
Epidural Patient location during procedure: OB Start time: 08/22/2016 3:40 AM End time: 08/22/2016 4:06 AM  Preanesthetic Checklist Completed: patient identified, site marked, surgical consent, pre-op evaluation, timeout performed, IV checked, risks and benefits discussed and monitors and equipment checked  Epidural Patient position: sitting Prep: Betadine Patient monitoring: heart rate, continuous pulse ox and blood pressure Approach: midline Location: L4-L5 Injection technique: LOR saline  Needle:  Needle type: Tuohy  Needle gauge: 17 G Needle length: 9 cm and 9 Needle insertion depth: 8 cm Catheter type: closed end flexible Catheter size: 19 Gauge Catheter at skin depth: 11 cm Test dose: negative and 1.5% lidocaine with Epi 1:200 K  Assessment Events: blood not aspirated, injection not painful, no injection resistance, negative IV test and no paresthesia  Additional Notes   Patient tolerated the insertion well without complications.Reason for block:procedure for pain

## 2016-08-22 NOTE — Progress Notes (Signed)
S:  I was called by RN to assess late decelerations - currently giving IVF bolus and lateral positioning      Pt. Rating ctxs 5/10  O:  VS: Blood pressure (!) 139/91, pulse 84, temperature 98.1 F (36.7 C), temperature source Oral, resp. rate 18, height 5\' 3"  (1.6 m), weight 82.6 kg (182 lb), last menstrual period 11/09/2015.        FHR : baseline 150 bpm / variability moderate / accelerations + / late/variable decelerations        Toco: contractions every 2-6 minutes / moderate        Cervix : Dilation: 1 Effacement (%): 30 Station: -2 Exam by:: ALogan Bores. Evans, RN        Membranes: Intact  A: Induction of labor     FHR category 2  P: Will closely monitor to see if fetal tracing improves with IVF bolus and left lateral positioning     If no improvement, remove Cervidil - RN aware to pull Cervidil and call me for recurrent lates     Appears to be resolving, but will continue to monitor closely    Reassess in 1-2 hours  Delta Air LinesMeredith Denton Tate,

## 2016-08-22 NOTE — Progress Notes (Signed)
S:  Pt. Comfortable with epidural, feeling slight pressure   O:  VS: Blood pressure 121/73, pulse 82, temperature 98.1 F (36.7 C), temperature source Oral, resp. rate 18, height 5\' 3"  (1.6 m), weight 82.6 kg (182 lb), last menstrual period 11/09/2015.        FHR : baseline 150 bpm  / variability moderate / accelerations + / variable decelerations to nadir of 90 bpm - now resolved         Toco: contractions every 1-3 minutes / strong        Cervix : Dilation: 10 Dilation Complete Date: 08/22/16 Dilation Complete Time: 0659 Effacement (%): 100 Cervical Position: Middle Station: +2 Presentation: Vertex Exam by:: Carlean JewsMeredith Treniyah Lynn CNM        Membranes: AROM - scant bloody fluid   A: Active labor     FHR category 2  P: Turned down epidural and pt. In high fowlers     Anticipate NSVD soon  Dr. Tiburcio PeaHarris updated on plan of care   Carlean JewsMeredith Saje Gallop, CNM

## 2016-08-22 NOTE — Progress Notes (Signed)
Pt. Currently getting epidural.  Dr. Tiburcio PeaHarris notified of Category 2 fetal monitoring strip with quick progression. Continue expectant management. Anticipate NSVD soon.   Carlean JewsMeredith Daquane Aguilar, CNM

## 2016-08-22 NOTE — Progress Notes (Addendum)
S:  Pt. Progressing quickly      Last check 5.5cm, and 20 minutes later she is 8cm     Requesting epidural      Has had some intermittent elevated BPs likely due to pain   O:  VS: Blood pressure 133/73, pulse 83, temperature 98.1 F (36.7 C), temperature source Oral, resp. rate 18, height 5\' 3"  (1.6 m), weight 82.6 kg (182 lb), last menstrual period 11/09/2015.        FHR : baseline 120 bpm / variability moderate / accelerations + / early and variable decelerations to a nadir of 100bpm with good return to baseline         Toco: contractions every 2-4 minutes / moderate         Cervix : Dilation: 8 Effacement (%): 90 Station: 0 Presentation: Vertex Exam by:: Joelene MillinM. Sigmon, CNM        Membranes: intact A: Active labor     FHR category 2  P: Anticipate NSVD soon    Check protein/creatnine ratio   Carlean JewsMeredith Sigmon, CNM

## 2016-08-23 DIAGNOSIS — D62 Acute posthemorrhagic anemia: Secondary | ICD-10-CM

## 2016-08-23 LAB — CBC
HEMATOCRIT: 28.8 % — AB (ref 35.0–47.0)
HEMOGLOBIN: 9.6 g/dL — AB (ref 12.0–16.0)
MCH: 27.8 pg (ref 26.0–34.0)
MCHC: 33.2 g/dL (ref 32.0–36.0)
MCV: 83.9 fL (ref 80.0–100.0)
Platelets: 185 10*3/uL (ref 150–440)
RBC: 3.44 MIL/uL — AB (ref 3.80–5.20)
RDW: 15.5 % — ABNORMAL HIGH (ref 11.5–14.5)
WBC: 8.4 10*3/uL (ref 3.6–11.0)

## 2016-08-23 LAB — RPR: RPR Ser Ql: NONREACTIVE

## 2016-08-23 MED ORDER — FERROUS SULFATE 325 (65 FE) MG PO TABS
325.0000 mg | ORAL_TABLET | Freq: Two times a day (BID) | ORAL | Status: DC
  Administered 2016-08-23 – 2016-08-24 (×3): 325 mg via ORAL
  Filled 2016-08-23 (×3): qty 1

## 2016-08-23 NOTE — Anesthesia Postprocedure Evaluation (Signed)
Anesthesia Post Note  Patient: Katie Tate  Procedure(s) Performed: * No procedures listed *  Patient location during evaluation: Mother Baby Anesthesia Type: Epidural Level of consciousness: awake and alert Pain management: pain level controlled Vital Signs Assessment: post-procedure vital signs reviewed and stable Respiratory status: spontaneous breathing, nonlabored ventilation and respiratory function stable Cardiovascular status: stable Postop Assessment: no headache, no backache and epidural receding Anesthetic complications: no     Last Vitals:  Vitals:   08/22/16 2339 08/23/16 0346  BP: 119/78 115/71  Pulse: 78 86  Resp: 18 18  Temp: 36.6 C 37.6 C    Last Pain:  Vitals:   08/23/16 0346  TempSrc: Oral  PainSc:                  Starling Mannsurtis,  Alithia Zavaleta A

## 2016-08-23 NOTE — Progress Notes (Addendum)
PPD #1, SVD, baby Boy "Christian"  S:  Reports feeling good              Tolerating po/ No nausea or vomiting             Bleeding is light             Pain controlled withMotrin             Up ad lib / ambulatory / voiding QS  Newborn breast feeding going well   O:               VS: BP 114/78 (BP Location: Left Arm)   Pulse 79   Temp 97.9 F (36.6 C) (Oral)   Resp 20   Ht 5\' 3"  (1.6 m)   Wt 82.6 kg (182 lb)   LMP 11/09/2015   Breastfeeding? Unknown   BMI 32.24 kg/m    LABS:              Recent Labs  08/21/16 2015 08/23/16 0554  WBC 7.1 8.4  HGB 11.5* 9.6*  PLT 243 185               Blood type: --/--/A POS (01/08 2015)  Rubella: Immune (06/13 0000)                     I&O: Intake/Output      01/09 0701 - 01/10 0700 01/10 0701 - 01/11 0700   P.O. 120    I.V. (mL/kg) 1171.4 (14.2)    IV Piggyback     Total Intake(mL/kg) 1291.4 (15.6)    Urine (mL/kg/hr) 400 (0.2)    Total Output 400     Net +891.4                        Physical Exam:             Alert and oriented X3  Lungs: Clear and unlabored  Heart: regular rate and rhythm / no mumurs  Abdomen: soft, non-tender, non-distended              Fundus: firm, non-tender, U-1  Perineum: well approximated 1st degree laceration, healing well, so significant edema or erythema   Lochia: appropriate, no clots  Extremities: noedema, no calf pain or tenderness    A: PPD # 1   Doing well - stable status  ABL Anemia  GBS Positive   Varicella non-immune  P: Routine post partum orders  Ferrous Sulfate BID  Offer Varivax prior to discharge  Anticipate D/C later today - adequately treated for GBS  Carlean JewsMeredith Lyncoln Ledgerwood, CNM

## 2016-08-23 NOTE — Discharge Summary (Signed)
Obstetrical Discharge Summary  Patient Name: Katie Tate DOB: 10/11/1980 MRN: [id number]  Date of Admission: 08/21/2016 Date of Discharge: 08/24/16 Primary [facility]: [facility]  Gestational Age at Delivery: 2428w0d   Antepartum complications: GBS positive, obesity, varicella non-immune, anemia, dental caries, asthma, AMA Admitting Diagnosis: Induction of labor for postdates Secondary Diagnosis: Patient Active Problem List   Diagnosis Date Noted  . Postpartum care following vaginal delivery 08/23/2016  . Acute blood loss anemia 08/23/2016  . Labor and delivery, indication for care 08/21/2016  . Irregular contractions 08/16/2016  . Advanced maternal age, primigravida in second trimester, antepartum 02/24/2016    Augmentation: AROM and Cervidil Complications: None Intrapartum complications/course: GBS Positive - with adequate treatment, Pusing with UC's and vtx delivered, NO CAN noted, Ant and post shoulder and body delivered at 0814 and to mom's abd, terminal meconium noted, delayed cord clamping, CCx2 and cut. SDOP intact at 0819. FF and lochia mod. 1st degree repair done with 2.0 and 3.0 vicryl   Date of Delivery: 08/22/16 Delivered By: Carlean JewsMeredith Sigmon, CNM  Delivery Type: spontaneous vaginal delivery Anesthesia: epidural Placenta: sponatneous Laceration:  Episiotomy: none Newborn Data: Live born female  Birth Weight: 7 lb 2.3 oz (3240 g) APGAR: 8, 9    Postpartum Procedures: Oral FE  Post partum course: Patient had an uncomplicated postpartum course.  By time of discharge on PPD#2, her pain was controlled on oral pain medications; she had appropriate lochia and was ambulating, voiding without difficulty and tolerating regular diet.  She was deemed stable for discharge to home.    Discharge Physical Exam:  BP (!) 126/51 (BP Location: Right Arm)   Pulse 89   Temp 97.5 F (36.4 C) (Oral)   Resp 16   Ht 5\' 3"  (1.6 m)   Wt 82.6 kg (182 lb)   LMP 11/09/2015   SpO2 100%    Breastfeeding? Unknown   BMI 32.24 kg/m   General: NAD CV: RRR Pulm: CTABL, nl effort ABD: s/nd/nt, fundus firm and below the umbilicus Lochia: moderate Incision: c/d/i DVT Evaluation: LE non-ttp, no evidence of DVT on exam.  Hemoglobin  Date Value Ref Range Status  08/23/2016 9.6 (L) 12.0 - 16.0 g/dL Final   HGB  Date Value Ref Range Status  10/03/2013 13.4 12.0 - 16.0 g/dL Final   HCT  Date Value Ref Range Status  08/23/2016 28.8 (L) 35.0 - 47.0 % Final  10/03/2013 38.4 35.0 - 47.0 % Final     Disposition: stable, discharge to home. Baby Feeding: breast milk and bottle Baby Disposition: home with mom  Rh Immune globulin given: N/A Rubella vaccine given: Immune Varicella: non immune vaccine: ?  Tdap vaccine given in AP or PP setting:  Flu vaccine given in AP or PP setting:   Contraception: Paragard IUD  Prenatal Labs:   ABO, Rh: A/Positive/-- (06/13 0000) Antibody: Negative (06/13 0000) Rubella: Immune (06/13 0000)  RPR: Nonreactive (06/13 0000)  HBsAg: Negative (06/13 0000)  HIV: Non-reactive (06/13 0000)  Varicella: Non-immune GTT: 91 GBS: Positive (12/03 0000  Plan:  Katie Tate was discharged to home in good condition. [facility]  Discharge Medications: Motrin 600 mg q 8 hrs prn pain   Follow-up Information    [facility] .   Specialty:  General Practice Contact information: [address] [place] [address] [phone]           Signed: Ihor Austinhomas j Rucker Pridgeon MD

## 2016-08-23 NOTE — Progress Notes (Signed)
Late entry: Notified baby is going to stay due to bilirubin levels.  We will plan on d/c home tomorrow.   Carlean JewsMeredith Adelee Hannula, CNM

## 2016-08-24 MED ORDER — IBUPROFEN 600 MG PO TABS: 600.0000 mg | ORAL_TABLET | Freq: Four times a day (QID) | ORAL | 0 refills | Status: DC

## 2016-08-24 NOTE — Discharge Instructions (Signed)
Please call your doctor or return to the ER if you experience any chest pains, shortness of breath, fever greater than 101, any heavy bleeding or large clots, and foul smelling vaginal discharge, any worsening abdominal pain & cramping that is not controlled by pain medication, or any signs of post partum depression.  No tampons, enemas, douches, or sexual intercourse for 6 weeks.  Also avoid tub baths, hot tubs, or swimming for 6 weeks.    

## 2016-08-24 NOTE — Progress Notes (Signed)
Discharge order received from doctor. Reviewed discharge instructions and prescriptions with patient using an interpretor (loida) and answered all questions. Follow up appointment instructions given. Patient verbalized understanding. ID bands checked. Patient discharged home with infant via wheelchair by nursing/auxillary.    Oswald HillockAbigail Garner, RN

## 2017-04-19 ENCOUNTER — Other Ambulatory Visit: Payer: Self-pay

## 2017-04-19 ENCOUNTER — Emergency Department
Admission: EM | Admit: 2017-04-19 | Discharge: 2017-04-19 | Disposition: A | Discharge status: Home / Self Care | Payer: Managed Care, Other (non HMO) | Attending: Emergency Medicine | Admitting: Emergency Medicine

## 2017-04-19 ENCOUNTER — Emergency Department: Payer: Managed Care, Other (non HMO)

## 2017-04-19 ENCOUNTER — Encounter: Payer: Self-pay | Admitting: Emergency Medicine

## 2017-04-19 DIAGNOSIS — Y998 Other external cause status: Secondary | ICD-10-CM | POA: Diagnosis not present

## 2017-04-19 DIAGNOSIS — Y929 Unspecified place or not applicable: Secondary | ICD-10-CM | POA: Diagnosis not present

## 2017-04-19 DIAGNOSIS — M791 Myalgia: Secondary | ICD-10-CM | POA: Insufficient documentation

## 2017-04-19 DIAGNOSIS — S3992XA Unspecified injury of lower back, initial encounter: Secondary | ICD-10-CM | POA: Diagnosis not present

## 2017-04-19 DIAGNOSIS — J45909 Unspecified asthma, uncomplicated: Secondary | ICD-10-CM | POA: Diagnosis not present

## 2017-04-19 DIAGNOSIS — Z79899 Other long term (current) drug therapy: Secondary | ICD-10-CM | POA: Insufficient documentation

## 2017-04-19 DIAGNOSIS — M7918 Myalgia, other site: Secondary | ICD-10-CM

## 2017-04-19 DIAGNOSIS — W2210XA Striking against or struck by unspecified automobile airbag, initial encounter: Secondary | ICD-10-CM | POA: Insufficient documentation

## 2017-04-19 DIAGNOSIS — R0789 Other chest pain: Secondary | ICD-10-CM | POA: Diagnosis present

## 2017-04-19 DIAGNOSIS — M25561 Pain in right knee: Secondary | ICD-10-CM | POA: Diagnosis not present

## 2017-04-19 DIAGNOSIS — Y9389 Activity, other specified: Secondary | ICD-10-CM | POA: Insufficient documentation

## 2017-04-19 HISTORY — DX: Unspecified asthma, uncomplicated: J45.909

## 2017-04-19 MED ORDER — IBUPROFEN 600 MG PO TABS: 600.0000 mg | ORAL_TABLET | Freq: Four times a day (QID) | ORAL | 0 refills | Status: DC | PRN

## 2017-04-19 MED ORDER — CYCLOBENZAPRINE HCL 5 MG PO TABS: 5.0000 mg | ORAL_TABLET | Freq: Three times a day (TID) | ORAL | 0 refills | Status: DC | PRN

## 2017-04-19 MED ORDER — ACETAMINOPHEN 325 MG PO TABS
650.0000 mg | ORAL_TABLET | Freq: Once | ORAL | Status: AC
  Administered 2017-04-19: 650 mg via ORAL
  Filled 2017-04-19: qty 2
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
## 2017-04-30 ENCOUNTER — Other Ambulatory Visit: Payer: Self-pay | Admitting: Family Medicine

## 2017-04-30 ENCOUNTER — Ambulatory Visit
Admission: RE | Admit: 2017-04-30 | Discharge: 2017-04-30 | Disposition: A | Discharge status: Home / Self Care | Payer: Managed Care, Other (non HMO) | Source: Ambulatory Visit | Attending: Family Medicine | Admitting: Family Medicine

## 2017-04-30 DIAGNOSIS — R0781 Pleurodynia: Secondary | ICD-10-CM | POA: Diagnosis present

## 2017-05-30 ENCOUNTER — Ambulatory Visit: Payer: Managed Care, Other (non HMO) | Attending: Family Medicine | Admitting: Physical Therapy

## 2017-05-30 DIAGNOSIS — M25512 Pain in left shoulder: Secondary | ICD-10-CM | POA: Diagnosis present

## 2017-05-30 DIAGNOSIS — M542 Cervicalgia: Secondary | ICD-10-CM | POA: Insufficient documentation
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
## 2017-05-30 NOTE — Patient Instructions (Signed)
R - 61, 60,   L 30, 41, 47  VOR x 1 with horizontal head turns - positive, with vertical - negative   Overpressure into flexion felt it go down her back (stretching)   ULTT - full extension - felt a cramp   ULTT with IR- pins and needles  IR/ER - WNL for ROM   PROM into flexion - normal motion

## 2017-06-01 ENCOUNTER — Ambulatory Visit: Payer: Managed Care, Other (non HMO)

## 2017-06-01 DIAGNOSIS — M542 Cervicalgia: Secondary | ICD-10-CM

## 2017-06-01 DIAGNOSIS — M25512 Pain in left shoulder: Secondary | ICD-10-CM

## 2017-06-01 NOTE — Therapy (Signed)
[facility] [facility] PHYSICAL AND SPORTS MEDICINE [address] , [place], [address] Phone: [phone]   Fax:  [phone]  Physical Therapy Treatment  Patient Details  Name: Katie SpanielLorena Martinez Tate MRN: [id number] Date of Birth: 06/16/1981 Referring Provider: Maryruth HancockSallie Patel  Encounter Date: 06/01/2017      PT End of Session - 06/01/17 2152    Visit Number 2   Number of Visits 9   Date for PT Re-Evaluation 07/11/17   PT Start Time 0805   PT Stop Time 0855   PT Time Calculation (min) 50 min   Activity Tolerance Patient tolerated treatment well   Behavior During Therapy [facility] for tasks assessed/performed      Past Medical History:  Diagnosis Date  . Advanced maternal age in multigravida 2017  . Asthma     Past Surgical History:  Procedure Laterality Date  . NO PAST SURGERIES      There were no vitals filed for this visit.      Subjective Assessment - 06/01/17 0812    Subjective Pt reports she is slightly better today compared to initial evaluation but is still having significant pain. She continues to report LUE numbness/tingling. No specific questions or concerns at this time.    Patient is accompained by: Interpreter   Limitations Lifting;House hold activities  Work related lifting activities    Diagnostic tests X-rays were negative for fractures   Patient Stated Goals To be able to return to work and have less pain/numbness   Currently in Pain? Yes   Pain Score 7    Pain Location Back   Pain Orientation Left;Upper   Pain Descriptors / Indicators Aching   Pain Type Acute pain   Pain Onset 1 to 4 weeks ago   Pain Frequency Constant          Treatment  Manual Therapy MHP to cervical spine during history; CPA grade II, 30s/bout x 1 bout/level C2-T4, positive reproduction of LUE symptoms; L UPA grade II, 30s/bout x 1 bout/level C2-T4, positive reproduction of LUE symptoms; Soft tissue mobilization over rhomboids, levator  scapulae, and cervical extensor musculature, patient reported significant relief after completion.  Supine cervical distraction, 20s hold x 4 bouts; L upper trap and levator stretches 30s hold x 2 each; Extensive concussion education with handouts in both English and Spanish;  E-stim HiVolt estim to L upper trap and rhomboids at pt tolerated intensity (105V and 140v) with MHP,  2/10 pain reported after therapy session                         PT Education - 06/01/17 2151    Education provided Yes   Education Details Plan of care, prognosis   Person(s) Educated Patient   Methods Explanation   Comprehension Verbalized understanding             PT Long Term Goals - 05/30/17 1212      PT LONG TERM GOAL #1   Title Patient will report an mODI score of less than 28% to demonstrate improved tolerance for ADLs.   Baseline 38%   Time 6   Period Weeks   Status New   Target Date 07/11/17     PT LONG TERM GOAL #2   Title Patient will report worst pain of no more than 2/10 to demonstrate improved tolerance for ADLs.    Baseline 6/10   Time 6   Period Weeks   Status New  Target Date 07/11/17     PT LONG TERM GOAL #3   Title Patient will report no numbness in her LUE to allow for full return to work duties.   Baseline Constant numbness in her LUE.    Time 6   Period Weeks   Status New   Target Date 07/11/17     PT LONG TERM GOAL #4   Title Patient will report no increased pain with work related activities to demonstrate improved tolerance for ADLs.    Baseline Significant pain after working.    Time 6   Period Weeks   Status New   Target Date 07/11/17               Plan - 06/01/17 2152    Clinical Impression Statement Reproduction of LUE symptoms today with central and L unilateral mobilizations to cervical and thoracic spine. Pt reports significant improvement in her symptoms from 7/10 to 2/10 at the end of the session. She will continue to  benefit from therapy to further reduce pain until she can tolerate cervical and upper quarter strengthening.     Clinical Presentation Evolving   Rehab Potential Good   PT Frequency 2x / week   PT Duration 4 weeks   PT Treatment/Interventions Passive range of motion;Manual techniques;Patient/family education;Neuromuscular re-education;Therapeutic exercise;Therapeutic activities;Traction;Cryotherapy;Electrical Stimulation;Dry needling   PT Next Visit Plan Progress manual therapy, add in directional isometrics as needed/appropriate, estim as appropriate   PT Home Exercise Plan None at this time, too painful.    Consulted and Agree with Plan of Care Patient      Patient will benefit from skilled therapeutic intervention in order to improve the following deficits and impairments:  Pain, Impaired UE functional use, Decreased strength  Visit Diagnosis: Neck pain  Acute pain of left shoulder     Problem List Patient Active Problem List   Diagnosis Date Noted  . Postpartum care following vaginal delivery 08/23/2016  . Acute blood loss anemia 08/23/2016  . Labor and delivery, indication for care 08/21/2016  . Irregular contractions 08/16/2016  . Advanced maternal age, primigravida in second trimester, antepartum 02/24/2016   Lynnea Maizes PT, DPT   Rafel Garde 06/01/2017, 10:08 PM  [place] [facility] PHYSICAL AND SPORTS MEDICINE [address], [place], [address] Phone: [phone]   Fax:  [phone]  Name: Katie Tate MRN: [id number] Date of Birth: [birth year]

## 2017-06-01 NOTE — Patient Instructions (Addendum)
Conmocin cerebral - Adultos (Concussion, Adult) Una conmocin es una lesin cerebral. Las causas pueden ser:  Un golpe en la cabeza.  Un movimiento rpido y brusco (sacudida) de la cabeza o el cuello. Generalmente no pone en peligro la vida. Sin embargo, puede causar problemas graves. Si sufri una conmocin cerebral antes, puede tener sntomas similares a la conmocin despus de un golpe en la cabeza. CUIDADOS EN EL HOGAR Instrucciones generales  Siga cuidadosamente las indicaciones del mdico.  Tome los medicamentos como le indic el mdico.  Solo tome los medicamentos que su mdico considere seguros.  No beba alcohol hasta que el mdico lo autorice. El alcohol y algunos medicamentos pueden hacer ms lenta la curacin. Tambin pueden ponerlo en riesgo de sufrir otras lesiones.  Si tiene dificultad para recordar las cosas, escrbalas.  Trate de hacer una cosa por vez si se distrae con facilidad. Por ejemplo, no mire televisin mientras prepara la cena.  Consulte con familiares y amigos si debe tomar decisiones importantes.  Concurra a las consultas de control con el mdico, segn las indicaciones.  Observe sus sntomas. Dgale a los dems que hagan lo mismo. En algunos casos, despus de una conmocin cerebral surgen problemas graves. Es ms probable que Limited Brandsestos problemas graves los sufran los adultos [place].  Informe a los [address]estros, el departamento de enfermera de la escuela, el consejero escolar, Public affairs consultantel entrenador o director acerca de su conmocin cerebral. Hgales saber lo que puede y lo que no Scientist, product/process developmentpuede hacer. Ellos debern observarlo para ver si: ? Tiene ms dificultad para prestar atencin o concentrarse. ? Le cuesta an ms recordar las cosas o aprender cosas nuevas. ? Necesita ms tiempo que lo habitual para finalizar las tareas. ? Est ms molesto (irritable) que antes. ? Tampoco puede Charity fundraisercontrolar el estrs. ? Tiene ms problemas que antes.  Haga reposo. Asegrese de  que: ? Duerme bien por la noche. ? Se va a dormir temprano. ? Acustese CarMaxtodos los das a la misma hora aproximadamente. Trate de despertarse a la misma hora. ? Descanse Administratordurante el da. ? Tome una siesta cuando se sienta cansado.  Limite las actividades que requieran mucha concentracin. Estas pueden ser: ? Hacer la tarea para Advice workerel hogar. ? Hacer tareas relacionadas con un trabajo. ? Mirar televisin. ? Usar la computadora. Regreso a las State Street Corporationactividades habituales Regrese a las actividades habituales gradualmente, no [place]quiera hacer todo de Building control surveyoruna vez. Debe darles al cuerpo y el cerebro su tiempo para recuperarse.  No practique deportes ni realice otras actividades deportivas hasta que el mdico lo autorice.  Consulte a su mdico cundo puede andar en bicicleta o conducir otros vehculos o mquinas. Nunca haga estas cosas si se siente mareado.  Pregunte a su mdico cundo podr volver a la escuela o al Aleen Campitrabajo. Prevencin de otra conmocin cerebral Es muy importante que evite otra lesin cerebral, especialmente antes de que se haya recuperado. En casos raros, una nueva lesin puede causar daos cerebrales permanentes, hinchazn del cerebro o la muerte. El riesgo es mayor durante los primeros 7 a 10das despus de una lesin. Evite las lesiones:  Use el cinturn de seguridad al conducir un automvil.  Evite beber alcohol en exceso.  Evite las actividades que puedan favorecer una segunda conmocin cerebral (como al practicar deportes de contacto).  Use un casco cuando practique actividades como: ? Andar en bicicleta. ? Esquiar. ? Andar en patineta. ? Andar en patines.  Haga de su hogar un lugar ms seguro: ? Retire las cosas del piso o  de las escaleras que podran hacerlo tropezar. ? Coloque barras en los baos y KeyCorppasamanos en las escaleras. ? Ponga alfombras antideslizantes en pisos y baeras. ? Mejore la iluminacin en zonas de penumbra. SOLICITE AYUDA SI:  Tiene ms dificultad para  prestar atencin o concentrarse.  Le cuesta an ms recordar las cosas o aprender cosas nuevas.  Necesita ms tiempo que lo habitual para finalizar las tareas.  Est ms molesto (irritable) que antes.  Tampoco puede Charity fundraisercontrolar el estrs.  Tiene ms problemas que antes.  Tiene problemas para mantener el equilibrio.  No logra reaccionar rpidamente cuando lo necesita. Solicite ayuda si tiene alguno de estos problemas durante ms de 2 semanas:  Dolor de cabeza que perdura (crnico).  Mareos o problemas de equilibrio.  Ganas de vomitar (nuseas).  Problemas para ver (de vista).  Sentirse afectado por ruidos o la luz ms que lo normal.  Sentir tristeza, decaimiento, sufrimiento espiritual, melancola, pesimismo o vaco (depresin).  Cambios de humor (cambios en el estado de nimo).  Sensacin de miedo o nerviosismo por lo que podra ocurrir (ansiedad).  Se siente abrumado.  Problemas de memoria.  Dificultad para concentrarse o Engineer, technical salesmantener la atencin.  Problemas para dormir.  Cansancio permanente. SOLICITE AYUDA DE INMEDIATO SI:  Tiene dolores de cabeza intensos o estos empeoran.  Siente debilidad (aun si es solo en [place]una mano, una pierna o parte del rostro).  Tiene falta de sensibilidad (adormecimiento).  Siente que pierde el equilibrio.  No deja de vomitar.  Siente cansancio.  Uno de los centros negros del ojo (pupila) es ms grande que el otro.  Se retuerce o se sacude violentamente (tiene convulsiones).  El habla no es clara (arrastra las palabras).  Se siente ms confundido, se enoja con ms facilidad (agitacin) o est ms irritable que antes.  Tiene ms dificultad para descansar que antes.  No puede Nutritional therapistreconocer personas o lugares.  Siente dolor en el cuello.  Le resulta difcil despertarse.  Tiene cambios de conducta inusuales.  Se desmaya (pierde el conocimiento).  ASEGRESE DE QUE:  Comprende estas instrucciones.  Controlar su  afeccin.  Recibir ayuda de inmediato si no mejora o si empeora.  Esta informacin no tiene Theme park managercomo fin reemplazar el consejo del mdico. Asegrese de hacerle al mdico cualquier pregunta que tenga. Document Released: 09/02/2010 Document Revised: 08/21/2014 Document Reviewed: 02/20/2013 Elsevier Interactive Patient Education  2017 Elsevier Inc.  Concussion, Adult A concussion is a brain injury from a direct hit (blow) to the head or body. This injury causes the brain to shake quickly back and forth inside the skull. It is caused by:  A hit to the head.  A quick and sudden movement (jolt) of the head or neck.  How fast you will get better from a concussion depends on many things like how bad your concussion was, what part of your brain was hurt, how old you are, and how healthy you were before the concussion. Recovery can take time. It is important to wait to return to activity until a doctor says it is safe and your symptoms are all gone. Follow these instructions at home: Activity  Limit activities that need a lot of thought or concentration. These include: ? Homework or work for your job. ? Watching TV. ? Computer work. ? Playing memory games and puzzles.  Rest. Rest helps the brain to heal. Make sure you: ? Get plenty of sleep at night. Do not stay up late. ? Go to bed at the same time every day. ?  Rest during the day. Take naps or rest breaks when you feel tired.  It can be dangerous if you get another concussion before the first one has healed Do not do activities that could cause a second concussion, such as riding a bike or playing sports.  Ask your doctor when you can return to your normal activities, like driving, riding a bike, or using machinery. Your ability to react may be slower. Do not do these activities if you are dizzy. Your doctor will likely give you a plan for slowly going back to activities. General instructions  Take over-the-counter and prescription medicines  only as told by your doctor.  Do not drink alcohol until your doctor says you can.  If it is harder than usual to remember things, write them down.  If you are easily distracted, try to do one thing at a time. For example, do not try to watch TV while making dinner.  Talk with family members or close friends when you need to make important decisions.  Watch your symptoms and tell other people to do the same. Other problems (complications) can happen after a concussion. Older adults with a brain injury may have a higher risk of serious problems, such as a blood clot in the brain.  Tell your teachers, school nurse, school counselor, coach, Event organiser, or work Production designer, theatre/television/film about your injury and symptoms. Tell them about what you can or cannot do. They should watch for: ? More problems with attention or concentration. ? More trouble remembering or learning new information. ? More time needed to do tasks or assignments. ? Being more annoyed (irritable) or having a harder time dealing with stress. ? Any other symptoms that get worse.  Keep all follow-up visits as told by your health care provider. This is important. Prevention  It is very important that you donot get another brain injury, especially before you have healed. In rare cases, another injury can cause permanent brain damage, brain swelling, or death. You have the most risk if you get another head injury in the first 7-10 days after you were hurt before. To avoid injuries: ? Wear a seat belt when you ride in a car. ? Do not drink too much alcohol. ? Avoid activities that could make you get a second concussion, like contact sports. ? Wear a helmet when you do activities like:  Biking.  Skiing.  Skateboarding.  Skating. ? Make your home safe by:  Removing things from the floor or stairs that could make you trip.  Using grab bars in bathrooms and handrails by stairs.  Placing non-slip mats on floors and in  bathtubs.  Putting more light in dark areas. Contact a doctor if:  Your symptoms get worse.  You have new symptoms.  You keep having symptoms for more than 2 weeks. Get help right away if:  You have bad headaches, or your headaches get worse.  You have weakness in any part of your body.  You have loss of feeling (numbness).  You feel off balance.  You keep throwing up (vomiting).  You feel more sleepy.  The black center of one eye (pupil) is bigger than the other one.  You twitch or shake violently (convulse) or have a seizure.  Your speech is not clear (is slurred).  You feel more tired, more confused, or more annoyed.  You do not recognize people or places.  You have neck pain.  It is hard to wake you up.  You have strange  behavior changes.  You pass out (lose consciousness). Summary  A concussion is a brain injury from a direct hit (blow) to the head or body.  This condition is treated with rest and careful watching of symptoms.  If you keep having symptoms for more than 2 weeks, call your doctor. This information is not intended to replace advice given to you by your health care provider. Make sure you discuss any questions you have with your health care provider. Document Released: 07/19/2009 Document Revised: 07/15/2016 Document Reviewed: 07/15/2016 Elsevier Interactive Patient Education  2017 [employer].

## 2017-06-05 ENCOUNTER — Ambulatory Visit: Payer: Managed Care, Other (non HMO) | Admitting: Physical Therapy

## 2017-06-05 DIAGNOSIS — M25512 Pain in left shoulder: Secondary | ICD-10-CM

## 2017-06-05 DIAGNOSIS — M542 Cervicalgia: Secondary | ICD-10-CM | POA: Diagnosis not present

## 2017-06-05 NOTE — Therapy (Signed)
[place] [facility] PH[facility] SPORTS MEDICINE [address], [place], [address] Phone: [phone]   Fax:  [phone]  Physical Therapy Treatment  Patient Details  Name: Katie Tate MRN: [id number] Date of Birth: 14-Oct-1980 Referring Provider: Maryruth Hancock  Encounter Date: 06/05/2017      PT End of Session - 06/05/17 1632    Visit Number 3   Number of Visits 9   Date for PT Re-Evaluation 07/11/17   PT Start Time 1532   PT Stop Time 1618   PT Time Calculation (min) 46 min   Activity Tolerance Patient tolerated treatment well   Behavior During Therapy [facility] for tasks assessed/performed      Past Medical History:  Diagnosis Date  . Advanced maternal age in multigravida 2017  . Asthma     Past Surgical History:  Procedure Laterality Date  . NO PAST SURGERIES      There were no vitals filed for this visit.      Subjective Assessment - 06/05/17 1624    Subjective Pt reports that her neck pain is improving since beginning PT.  She reports she has been doing her home exercises.   Patient is accompained by: Interpreter   Limitations Lifting;House hold activities   Patient Stated Goals To be able to return to work and have less pain/numbness   Currently in Pain? Yes   Pain Score 6    Pain Location Back   Pain Orientation Left;Upper   Pain Descriptors / Indicators Aching   Pain Type Acute pain   Pain Onset 1 to 4 weeks ago   Pain Frequency Constant                         [facility] Adult PT Treatment/Exercise - 06/05/17 0001      Exercises   Exercises Neck     Neck Exercises: Theraband   Rows 20 reps   Rows Limitations ytb- added to HEP     Neck Exercises: Standing   Neck Retraction Limitations chin tucks supine 20x     Neck Exercises: Seated   Other Seated Exercise seated scapular protraction stretch with hands interlaced 5 x 20 sec   Other Seated Exercise tennis ball massage in standing against  wall to rhomboids- added to HEP     Modalities   Modalities Electrical Stimulation     Electrical Stimulation   Electrical Stimulation Location --  IFC to L cervical region x 12 min with MHP in chair     Manual Therapy   Manual Therapy Soft tissue mobilization   Manual therapy comments STM to L cervical musculature, scapular musculature, rhomboids; grade 2 P-A glides to L cervical facet joints                PT Education - 06/05/17 1632    Education provided Yes   Education Details HEP / see patient instructions   Person(s) Educated Patient   Methods Handout;Explanation   Comprehension Returned demonstration             PT Long Term Goals - 05/30/17 1212      PT LONG TERM GOAL #1   Title Patient will report an mODI score of less than 28% to demonstrate improved tolerance for ADLs.   Baseline 38%   Time 6   Period Weeks   Status New   Target Date 07/11/17     PT LONG TERM GOAL #2   Title  Patient will report worst pain of no more than 2/10 to demonstrate improved tolerance for ADLs.    Baseline 6/10   Time 6   Period Weeks   Status New   Target Date 07/11/17     PT LONG TERM GOAL #3   Title Patient will report no numbness in her LUE to allow for full return to work duties.   Baseline Constant numbness in her LUE.    Time 6   Period Weeks   Status New   Target Date 07/11/17     PT LONG TERM GOAL #4   Title Patient will report no increased pain with work related activities to demonstrate improved tolerance for ADLs.    Baseline Significant pain after working.    Time 6   Period Weeks   Status New   Target Date 07/11/17               Plan - 06/05/17 1633    Clinical Impression Statement 2/10 at end of session today; pt responding well to manual therapy and modalities.  Added gentle strengthening today without problems.   Clinical Presentation Evolving   Clinical Decision Making Moderate   Rehab Potential Good   PT Frequency 2x / week    PT Duration 4 weeks   PT Treatment/Interventions Passive range of motion;Manual techniques;Patient/family education;Neuromuscular re-education;Therapeutic exercise;Therapeutic activities;Traction;Cryotherapy;Electrical Stimulation;Dry needling   PT Next Visit Plan progress manual therapy and strengthening ex's   PT Home Exercise Plan chin tucks, rows with ytb, thoracic scapular protraction stretch, tennis ball massage to rhomboids   Consulted and Agree with Plan of Care Patient      Patient will benefit from skilled therapeutic intervention in order to improve the following deficits and impairments:  Pain, Impaired UE functional use, Decreased strength  Visit Diagnosis: Neck pain  Acute pain of left shoulder     Problem List Patient Active Problem List   Diagnosis Date Noted  . Postpartum care following vaginal delivery 08/23/2016  . Acute blood loss anemia 08/23/2016  . Labor and delivery, indication for care 08/21/2016  . Irregular contractions 08/16/2016  . Advanced maternal age, primigravida in second trimester, antepartum 02/24/2016    [name], MPT 06/05/2017, 4:38 PM  [facility] [facility] [address] [place], [place], [address] Phone: [phone]   Fax:  [phone]  Name: Katie Tate MRN: [id number] Date of Birth: 11/07/1980

## 2017-06-05 NOTE — Patient Instructions (Signed)
Added to HEP:  Chin tucks 20x in supine; rows with YTB 2x10; seated scapular protraction stretch with hands interlaced 5 x 20 sec; standing tennis ball massage against wall to rhomboids.

## 2017-06-07 ENCOUNTER — Ambulatory Visit: Payer: Managed Care, Other (non HMO) | Admitting: Physical Therapy

## 2017-06-07 DIAGNOSIS — M25512 Pain in left shoulder: Secondary | ICD-10-CM

## 2017-06-07 DIAGNOSIS — M542 Cervicalgia: Secondary | ICD-10-CM | POA: Diagnosis not present

## 2017-06-07 NOTE — Therapy (Signed)
[place] [facility] [address] [place], [address] Phone: [phone]   Fax:  [phone]  Physical Therapy Treatment  Patient Details  Name: Cortney Beissel MRN: [id number] Date of Birth: 1981-07-14 Referring Provider: Maryruth Hancock  Encounter Date: 06/07/2017      PT End of Session - 06/07/17 1206    Visit Number 4   Number of Visits 9   Date for PT Re-Evaluation 07/11/17   PT Start Time 1046   PT Stop Time 1124   PT Time Calculation (min) 38 min   Activity Tolerance Patient tolerated treatment well   Behavior During Therapy [facility] for tasks assessed/performed      Past Medical History:  Diagnosis Date  . Advanced maternal age in multigravida 2017  . Asthma     Past Surgical History:  Procedure Laterality Date  . NO PAST SURGERIES      There were no vitals filed for this visit.      Subjective Assessment - 06/07/17 1159    Subjective Pt reports that her neck pain is improving since beginning PT.  She reports she has been doing her home exercises.  She states her L cervical region and L rhomboids region is still sore, however.   Patient is accompained by: Interpreter   Limitations Lifting;House hold activities   Patient Stated Goals To be able to return to work and have less pain/numbness   Currently in Pain? Yes   Pain Score 5    Pain Location Back   Pain Orientation Upper;Left   Pain Descriptors / Indicators Aching   Pain Type Acute pain   Pain Onset 1 to 4 weeks ago   Pain Frequency Constant                         [facility] Adult PT Treatment/Exercise - 06/07/17 0001      Neck Exercises: Theraband   Rows 20 reps  Red tband   Other Theraband Exercises stdg B ER with RTB 20x     Neck Exercises: Supine   Neck Retraction --  2x10 supine chin tucks     Electrical Stimulation   Electrical Stimulation Location --  IFC to L cervical reigon with MHP there & at rhomboids       Manual Therapy   Manual Therapy --  Soft tissue mob to L U/T, scalenes, levator scap, SCM    Manual Traction Suboccipital release with manual U/T and lev scap stretches; gentle gr. III rotational mobs to L c-spine                PT Education - 06/07/17 1206    Education provided Yes   Education Details Reviewed HEP and tennis ball massage for rhomboids with pt today.   Person(s) Educated Patient   Methods Explanation   Comprehension Verbalized understanding;Returned demonstration             PT Long Term Goals - 05/30/17 1212      PT LONG TERM GOAL #1   Title Patient will report an mODI score of less than 28% to demonstrate improved tolerance for ADLs.   Baseline 38%   Time 6   Period Weeks   Status New   Target Date 07/11/17     PT LONG TERM GOAL #2   Title Patient will report worst pain of no more than 2/10 to demonstrate improved tolerance for ADLs.    Baseline 6/10  Time 6   Period Weeks   Status New   Target Date 07/11/17     PT LONG TERM GOAL #3   Title Patient will report no numbness in her LUE to allow for full return to work duties.   Baseline Constant numbness in her LUE.    Time 6   Period Weeks   Status New   Target Date 07/11/17     PT LONG TERM GOAL #4   Title Patient will report no increased pain with work related activities to demonstrate improved tolerance for ADLs.    Baseline Significant pain after working.    Time 6   Period Weeks   Status New   Target Date 07/11/17               Plan - 06/07/17 1207    Clinical Impression Statement Pt responding well to manual therapy and modalities.  She asked about where to buy hot packs for home use stating that the MHP really alleviates her pain.   Clinical Presentation Evolving   Clinical Decision Making Moderate   Rehab Potential Good   PT Frequency 2x / week   PT Duration 4 weeks   PT Treatment/Interventions Passive range of motion;Manual techniques;Patient/family  education;Neuromuscular re-education;Therapeutic exercise;Therapeutic activities;Traction;Cryotherapy;Electrical Stimulation;Dry needling   PT Next Visit Plan progress manual therapy and strengthening ex's   PT Home Exercise Plan chin tucks, rows with ytb, thoracic scapular protraction stretch, tennis ball massage to rhomboids   Consulted and Agree with Plan of Care Patient      Patient will benefit from skilled therapeutic intervention in order to improve the following deficits and impairments:  Pain, Impaired UE functional use, Decreased strength  Visit Diagnosis: Neck pain  Acute pain of left shoulder     Problem List Patient Active Problem List   Diagnosis Date Noted  . Postpartum care following vaginal delivery 08/23/2016  . Acute blood loss anemia 08/23/2016  . Labor and delivery, indication for care 08/21/2016  . Irregular contractions 08/16/2016  . Advanced maternal age, primigravida in second trimester, antepartum 02/24/2016    Nafeesah Lapaglia, MPT 06/07/2017, 12:10 PM  [facility] [address] [place], [place], [address] Phone: [phone]   Fax:  [phone]  Name: Elberta SpanielLorena Martinez Flores MRN: [id number] Date of Birth: 06/11/1981

## 2017-06-12 ENCOUNTER — Ambulatory Visit: Payer: Managed Care, Other (non HMO) | Admitting: Physical Therapy

## 2017-06-12 DIAGNOSIS — M25512 Pain in left shoulder: Secondary | ICD-10-CM

## 2017-06-12 DIAGNOSIS — M542 Cervicalgia: Secondary | ICD-10-CM

## 2017-06-12 NOTE — Patient Instructions (Signed)
L grip strength to 41#, 40#  AROM of her shoulders for flexion is WNL   R rotation noted crepitus

## 2017-06-13 NOTE — Therapy (Signed)
[place] [facility] [address], [place], [address] Phone: [phone]   Fax:  [phone]  Physical Therapy Treatment  Patient Details  Name: Katie Tate MRN: [id number] Date of Birth: October 20, 1980 Referring Provider: Maryruth Hancock  Encounter Date: 06/12/2017      PT End of Session - 06/13/17 1213    Visit Number 5   Number of Visits 9   Date for PT Re-Evaluation 07/11/17   PT Start Time 1700   PT Stop Time 1735   PT Time Calculation (min) 35 min   Activity Tolerance Patient tolerated treatment well   Behavior During Therapy [facility] for tasks assessed/performed      Past Medical History:  Diagnosis Date  . Advanced maternal age in multigravida 2017  . Asthma     Past Surgical History:  Procedure Laterality Date  . NO PAST SURGERIES      There were no vitals filed for this visit.      Subjective Assessment - 06/12/17 1710    Subjective Patient reports she is feeling a little better, her pain comes and goes. She reports her shoulder becomes more painful when she is working, eases with rest. She states the numbness/tingling in her hands is present when she's not actively moving, but declines with movement. Reports her headaches/dizziness are still present and were exacerbated with traction machine.    Patient is accompained by: Interpreter   Limitations Lifting;House hold activities   Diagnostic tests X-rays were negative for fractures   Patient Stated Goals To be able to return to work and have less pain/numbness   Currently in Pain? Yes   Pain Location Neck   Pain Orientation Left;Upper   Pain Descriptors / Indicators Aching   Pain Type Acute pain   Pain Onset 1 to 4 weeks ago   Pain Frequency Intermittent      L grip strength to 41#, 40#  AROM of her shoulders for flexion is WNL   R rotation noted crepitus, no other pain reported with cervical movements, motion was WNL   Performed  joint mobilizations grade I-II through T1-T7 with patient reporting initial tenderness, which was relieved with repeated oscillations. Performed x 3 bouts at each joint level for 30-45" per segment. After completion patient reported no pain at rest or with movement.   Performed Seated rows on OMEGA with 20# x 12 repetitions, 30# x 10 repetitions with bilateral UE (no discomfort reported) .                            PT Education - 06/13/17 1212    Education provided Yes   Education Details Will progress with manual therapy given her progress thus far.    Person(s) Educated Patient   Methods Explanation   Comprehension Verbalized understanding             PT Long Term Goals - 05/30/17 1212      PT LONG TERM GOAL #1   Title Patient will report an mODI score of less than 28% to demonstrate improved tolerance for ADLs.   Baseline 38%   Time 6   Period Weeks   Status New   Target Date 07/11/17     PT LONG TERM GOAL #2   Title Patient will report worst pain of no more than 2/10 to demonstrate improved tolerance for ADLs.    Baseline 6/10   Time 6  Period Weeks   Status New   Target Date 07/11/17     PT LONG TERM GOAL #3   Title Patient will report no numbness in her LUE to allow for full return to work duties.   Baseline Constant numbness in her LUE.    Time 6   Period Weeks   Status New   Target Date 07/11/17     PT LONG TERM GOAL #4   Title Patient will report no increased pain with work related activities to demonstrate improved tolerance for ADLs.    Baseline Significant pain after working.    Time 6   Period Weeks   Status New   Target Date 07/11/17               Plan - 06/13/17 1213    Clinical Impression Statement Patient reports her resting symptoms have continued to lessen both in intensity and frequency/duration. She reports no pain at the end of this session, and appears to have made excellent progress towards listed therapy  goals..   Clinical Presentation Stable   Clinical Decision Making Moderate   Rehab Potential Good   PT Frequency 2x / week   PT Duration 4 weeks   PT Treatment/Interventions Passive range of motion;Manual techniques;Patient/family education;Neuromuscular re-education;Therapeutic exercise;Therapeutic activities;Traction;Cryotherapy;Electrical Stimulation;Dry needling   PT Next Visit Plan progress manual therapy and strengthening ex's   PT Home Exercise Plan chin tucks, rows with ytb, thoracic scapular protraction stretch, tennis ball massage to rhomboids   Consulted and Agree with Plan of Care Patient      Patient will benefit from skilled therapeutic intervention in order to improve the following deficits and impairments:  Pain, Impaired UE functional use, Decreased strength  Visit Diagnosis: Neck pain  Acute pain of left shoulder     Problem List Patient Active Problem List   Diagnosis Date Noted  . Postpartum care following vaginal delivery 08/23/2016  . Acute blood loss anemia 08/23/2016  . Labor and delivery, indication for care 08/21/2016  . Irregular contractions 08/16/2016  . Advanced maternal age, primigravida in second trimester, antepartum 02/24/2016   Alva GarnetPatrick Mackie Holness PT, DPT, CSCS    06/13/2017, 12:36 PM  [place] [facility] [address] [place], [place], [address] Phone: [phone]   Fax:  [phone]  Name: Katie Tate MRN: [id number] Date of Birth: 06/16/1981

## 2017-06-14 ENCOUNTER — Ambulatory Visit: Payer: Managed Care, Other (non HMO) | Attending: Family Medicine | Admitting: Physical Therapy

## 2017-06-14 DIAGNOSIS — M25512 Pain in left shoulder: Secondary | ICD-10-CM | POA: Diagnosis present

## 2017-06-14 DIAGNOSIS — M542 Cervicalgia: Secondary | ICD-10-CM | POA: Diagnosis not present

## 2017-06-14 NOTE — Therapy (Signed)
[place] [facility] [address], [place], [address] Phone: [phone]   Fax:  [phone]  Physical Therapy Treatment  Patient Details  Name: Katie Tate MRN: [id number] Date of Birth: 07-09-1981 Referring Provider: Maryruth Hancock  Encounter Date: 06/14/2017      PT End of Session - 06/14/17 1306    Visit Number 6   Number of Visits 9   Date for PT Re-Evaluation 07/11/17   PT Start Time 0955   PT Stop Time 1035   PT Time Calculation (min) 40 min   Activity Tolerance Patient tolerated treatment well   Behavior During Therapy [facility] for tasks assessed/performed      Past Medical History:  Diagnosis Date  . Advanced maternal age in multigravida 2017  . Asthma     Past Surgical History:  Procedure Laterality Date  . NO PAST SURGERIES      There were no vitals filed for this visit.      Subjective Assessment - 06/14/17 1302    Subjective Patient reports her pain continues to decline, today is 3 to 4/10, with periods of total relief. She still gets pain in L upper thoracic spine and peri-scapular area around rhomboids.    Patient is accompained by: Interpreter   Limitations Lifting;House hold activities   Diagnostic tests X-rays were negative for fractures   Patient Stated Goals To be able to return to work and have less pain/numbness   Currently in Pain? Yes   Pain Score 3    Pain Location Back   Pain Orientation Upper;Left   Pain Descriptors / Indicators Aching   Pain Type Acute pain   Pain Onset 1 to 4 weeks ago   Pain Frequency Intermittent      Grade I-II mobilizations provided to thoracic spine from T1 through T6, 3-5 bouts per level for 30-45"per bout with each level with good response noted by patient. Patient indicated some discomfort in L scapular area, performed x 5 bouts x 30-45" of grade I mobilizations, with positive response noted by patient.   Educated patient on use of foam  roller in supine and in standing with trunkal/vertebral extension appearing to be most beneficial for her.  Educated patient on use of tennis ball/lacrosse ball to apply pressure to tender/painful areas in upper thoracic area in standing.    Patient reported no pain to conclude session.                           PT Education - 06/14/17 1305    Education provided Yes   Education Details Educated patient on HEP and use of foam roller and soft tissue mobilization aides like tennis ball.    Person(s) Educated Patient   Methods Explanation;Handout;Demonstration   Comprehension Verbalized understanding;Returned demonstration             PT Long Term Goals - 05/30/17 1212      PT LONG TERM GOAL #1   Title Patient will report an mODI score of less than 28% to demonstrate improved tolerance for ADLs.   Baseline 38%   Time 6   Period Weeks   Status New   Target Date 07/11/17     PT LONG TERM GOAL #2   Title Patient will report worst pain of no more than 2/10 to demonstrate improved tolerance for ADLs.    Baseline 6/10   Time 6   Period Weeks  Status New   Target Date 07/11/17     PT LONG TERM GOAL #3   Title Patient will report no numbness in her LUE to allow for full return to work duties.   Baseline Constant numbness in her LUE.    Time 6   Period Weeks   Status New   Target Date 07/11/17     PT LONG TERM GOAL #4   Title Patient will report no increased pain with work related activities to demonstrate improved tolerance for ADLs.    Baseline Significant pain after working.    Time 6   Period Weeks   Status New   Target Date 07/11/17               Plan - 06/14/17 1306    Clinical Impression Statement Patient has responded quite well to manual therapy thus far and has left clinic with no pain to conclude the past two sessions. She was educated on use of soft tissue aides and foam roller for more prolonged relief, will likely decline to 1x  per week at follow up pending progress with HEP.    Clinical Presentation Stable   Clinical Decision Making Moderate   Rehab Potential Good   PT Frequency 2x / week   PT Duration 4 weeks   PT Treatment/Interventions Passive range of motion;Manual techniques;Patient/family education;Neuromuscular re-education;Therapeutic exercise;Therapeutic activities;Traction;Cryotherapy;Electrical Stimulation;Dry needling   PT Next Visit Plan progress manual therapy and strengthening ex's   PT Home Exercise Plan chin tucks, rows with ytb, thoracic scapular protraction stretch, tennis ball massage to rhomboids   Consulted and Agree with Plan of Care Patient      Patient will benefit from skilled therapeutic intervention in order to improve the following deficits and impairments:  Pain, Impaired UE functional use, Decreased strength  Visit Diagnosis: Neck pain  Acute pain of left shoulder     Problem List Patient Active Problem List   Diagnosis Date Noted  . Postpartum care following vaginal delivery 08/23/2016  . Acute blood loss anemia 08/23/2016  . Labor and delivery, indication for care 08/21/2016  . Irregular contractions 08/16/2016  . Advanced maternal age, primigravida in second trimester, antepartum 02/24/2016   Alva GarnetPatrick Liliana Brentlinger PT, DPT, CSCS    06/14/2017, 1:08 PM  [place] [facility] [address] [place], [place], [address] Phone: [phone]   Fax:  [phone]  Name: Katie Tate MRN: [id number] Date of Birth: 12/20/1980

## 2017-06-18 ENCOUNTER — Ambulatory Visit: Payer: Managed Care, Other (non HMO) | Admitting: Physical Therapy

## 2017-06-21 ENCOUNTER — Ambulatory Visit: Payer: Managed Care, Other (non HMO) | Admitting: Physical Therapy

## 2017-06-21 DIAGNOSIS — M542 Cervicalgia: Secondary | ICD-10-CM

## 2017-06-21 DIAGNOSIS — M25512 Pain in left shoulder: Secondary | ICD-10-CM

## 2017-06-24 NOTE — Therapy (Signed)
[place] [facility] [address] [place], [place], [address] Phone: [phone]   Fax:  [phone]  Physical Therapy Treatment  Patient Details  Name: Katie SpanielLorena Martinez Tate MRN: [id number] Date of Birth: 03/23/1981 Referring Provider: Maryruth HancockSallie Patel   Encounter Date: 06/21/2017  PT End of Session - 06/24/17 2301    Visit Number  7    Number of Visits  9    Date for PT Re-Evaluation  07/11/17    PT Start Time  1745    PT Stop Time  1820    PT Time Calculation (min)  35 min    Activity Tolerance  Patient tolerated treatment well    Behavior During Therapy  [facility] for tasks assessed/performed       Past Medical History:  Diagnosis Date  . Advanced maternal age in multigravida 2017  . Asthma     Past Surgical History:  Procedure Laterality Date  . NO PAST SURGERIES      There were no vitals filed for this visit.  Subjective Assessment - 06/24/17 2304    Subjective  Patient reports she is feeling much better, she has a slight bit of discomfort still, but is having much less discomfort.     Patient is accompained by:  Interpreter    Limitations  Lifting;House hold activities    Diagnostic tests  X-rays were negative for fractures    Patient Stated Goals  To be able to return to work and have less pain/numbness    Currently in Pain?  Other (Comment) Mild discomfort noted in upper thoracic spine and towards L scapulae    Pain Location  Back    Pain Orientation  Left;Upper    Pain Descriptors / Indicators  Aching    Pain Type  Chronic pain    Pain Onset  1 to 4 weeks ago    Pain Frequency  Intermittent        Patient reported mild discomfort in upper thoracic spine and L peri-scapular musculature in rhomboids/middle traps region. Performed x 5 bouts of 30-45" of joint mobilizations grade I-II with patient reporting minimal to no discomfort to complete after each segment/muscle group. Patient reports she has been  independent with her HEP and is happy with her progress.                       PT Education - 06/24/17 2301    Education provided  Yes    Education Details  Discharge instructions     Person(s) Educated  Patient    Methods  Explanation    Comprehension  Verbalized understanding          PT Long Term Goals - 05/30/17 1212      PT LONG TERM GOAL #1   Title  Patient will report an mODI score of less than 28% to demonstrate improved tolerance for ADLs.    Baseline  38%    Time  6    Period  Weeks    Status  New    Target Date  07/11/17      PT LONG TERM GOAL #2   Title  Patient will report worst pain of no more than 2/10 to demonstrate improved tolerance for ADLs.     Baseline  6/10    Time  6    Period  Weeks    Status  New    Target Date  07/11/17  PT LONG TERM GOAL #3   Title  Patient will report no numbness in her LUE to allow for full return to work duties.    Baseline  Constant numbness in her LUE.     Time  6    Period  Weeks    Status  New    Target Date  07/11/17      PT LONG TERM GOAL #4   Title  Patient will report no increased pain with work related activities to demonstrate improved tolerance for ADLs.     Baseline  Significant pain after working.     Time  6    Period  Weeks    Status  New    Target Date  07/11/17            Plan - 06/24/17 2302    Clinical Impression Statement  Patient reports she has made substantial improvement in her symptoms and has consistently left clinic with less discomfort than she presented with. She was educated on HEP and discharge instructions, she reports she is pleased with her progress towards established PT goals.     Clinical Presentation  Stable    Clinical Decision Making  Moderate    Rehab Potential  Good    PT Frequency  2x / week    PT Duration  4 weeks    PT Treatment/Interventions  Passive range of motion;Manual techniques;Patient/family education;Neuromuscular  re-education;Therapeutic exercise;Therapeutic activities;Traction;Cryotherapy;Electrical Stimulation;Dry needling    PT Next Visit Plan  progress manual therapy and strengthening ex's    PT Home Exercise Plan  chin tucks, rows with ytb, thoracic scapular protraction stretch, tennis ball massage to rhomboids    Consulted and Agree with Plan of Care  Patient       Patient will benefit from skilled therapeutic intervention in order to improve the following deficits and impairments:  Pain, Impaired UE functional use, Decreased strength  Visit Diagnosis: Neck pain  Acute pain of left shoulder     Problem List Patient Active Problem List   Diagnosis Date Noted  . Postpartum care following vaginal delivery 08/23/2016  . Acute blood loss anemia 08/23/2016  . Labor and delivery, indication for care 08/21/2016  . Irregular contractions 08/16/2016  . Advanced maternal age, primigravida in second trimester, antepartum 02/24/2016    Alva GarnetPatrick Jermal Dismuke PT, DPT, CSCS    06/24/2017, 11:05 PM  [place] [facility] [address] [place], [place], [address] Phone: [phone]   Fax:  [phone]  Name: Katie SpanielLorena Martinez Tate MRN: [id number] Date of Birth: 09/13/1980

## 2017-11-24 IMAGING — US US OB COMP +14 WK
1 series · 13 of 28 positions shown · non-contrast
Comparison: none

CLINICAL DATA: Uterine size date discrepancy.  Unsure of LMP.

EXAM:
OBSTETRICAL ULTRASOUND >14 WKS

[Series 1: us ob comp +14 wk · 0.23mm/px · 13 of 39 slices shown]
[im 2/39]
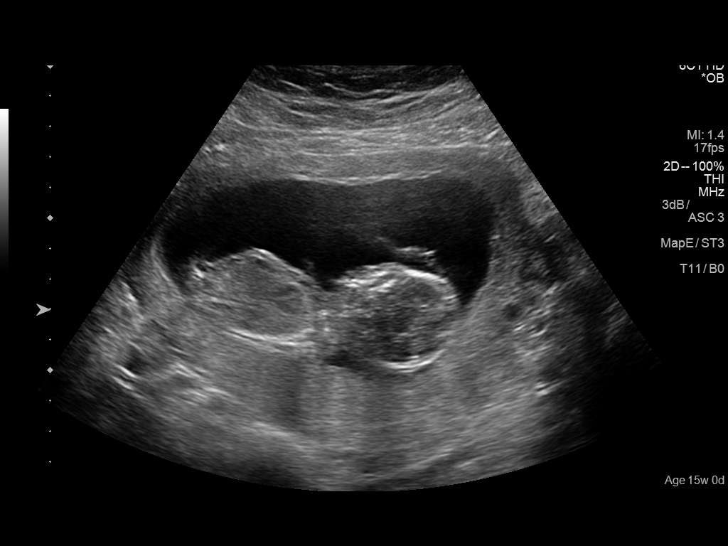
[im 5/39]
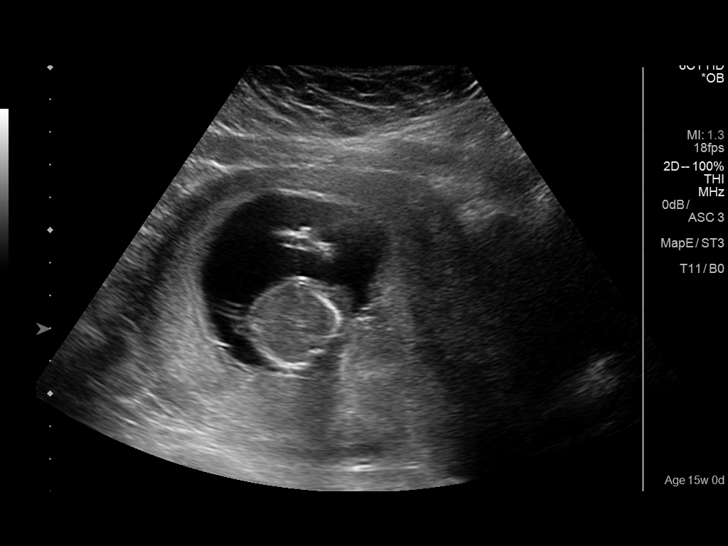
[im 8/39]
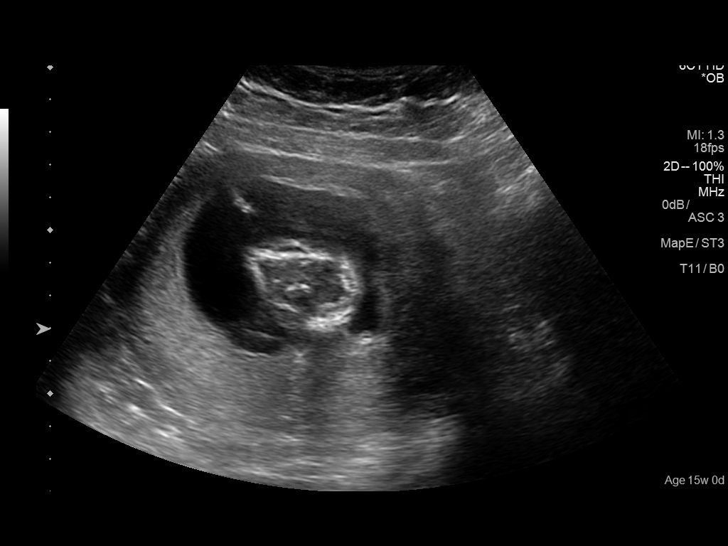
[im 10/39]
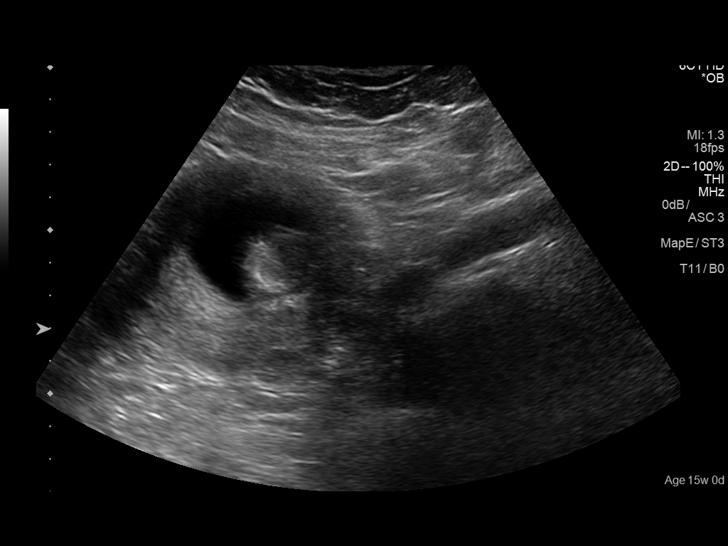
[im 13/39]
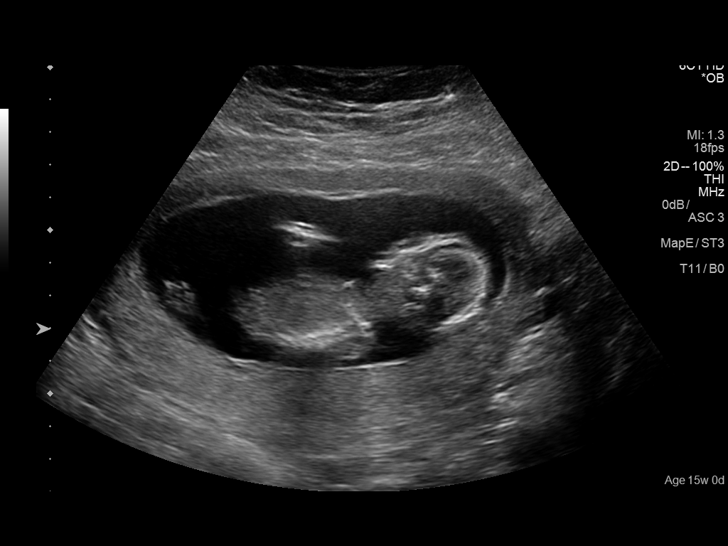
[im 16/39]
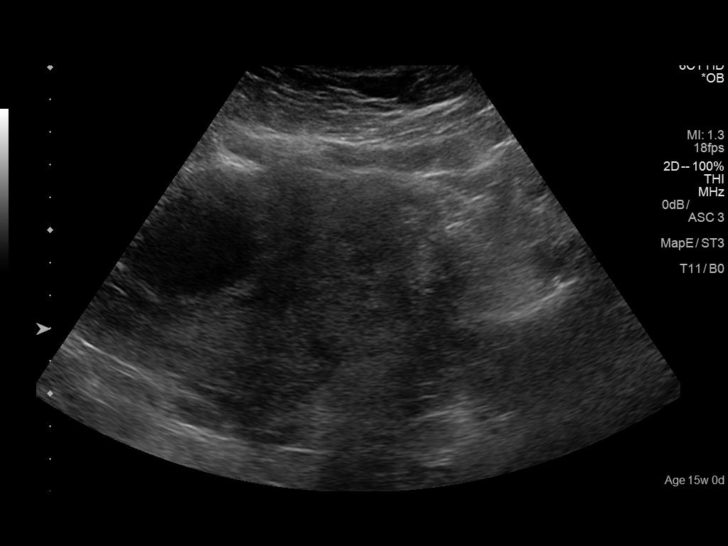
[im 20/39]
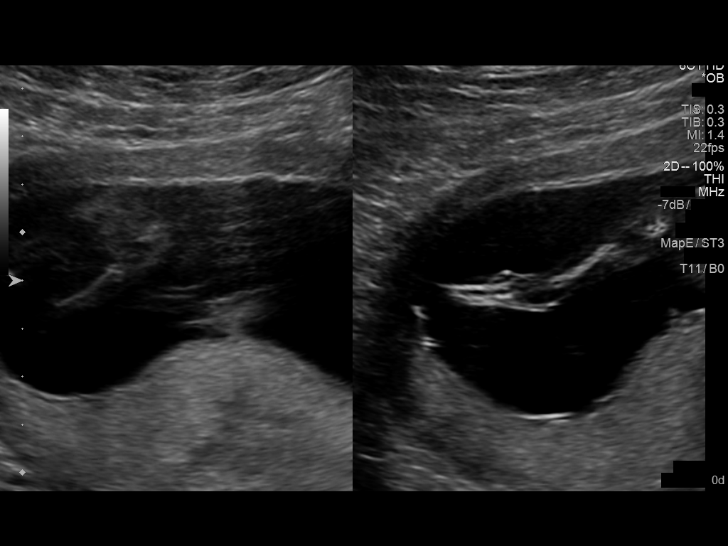
[im 23/39]
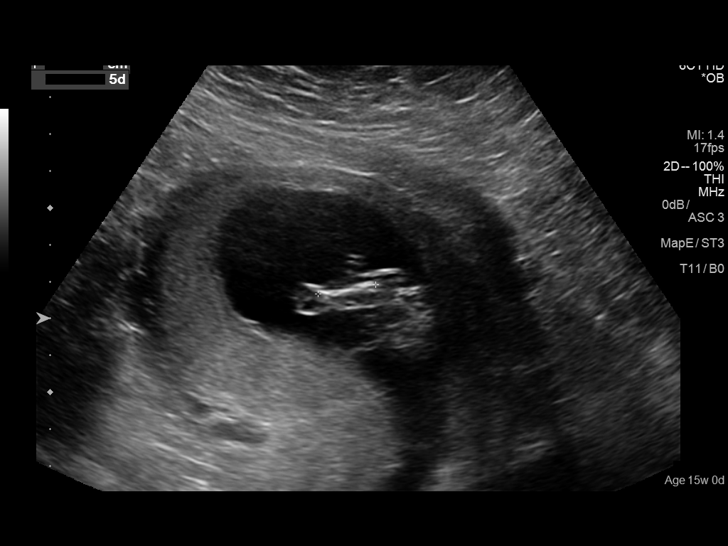
[im 26/39]
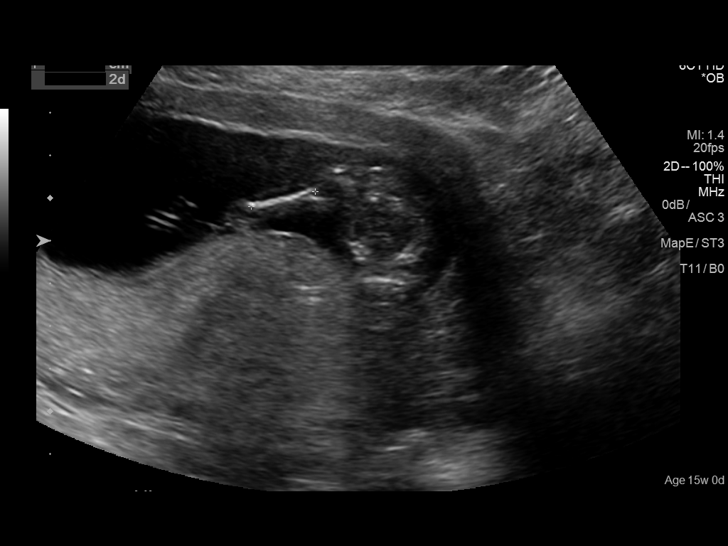
[im 29/39]
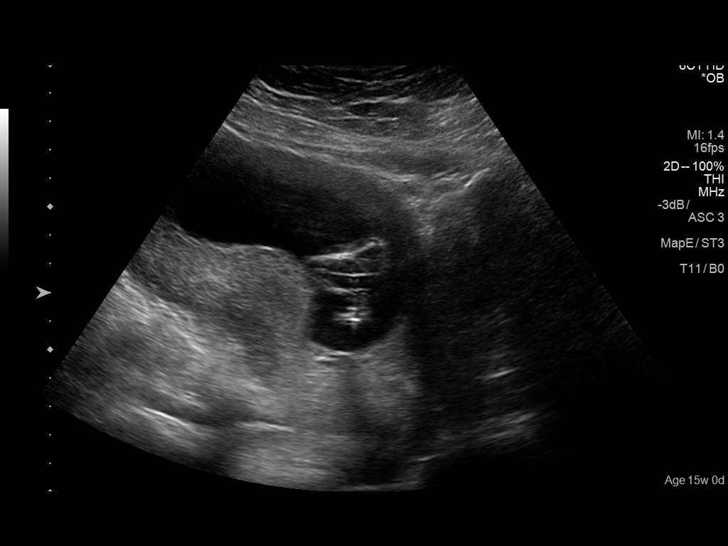
[im 31/39]
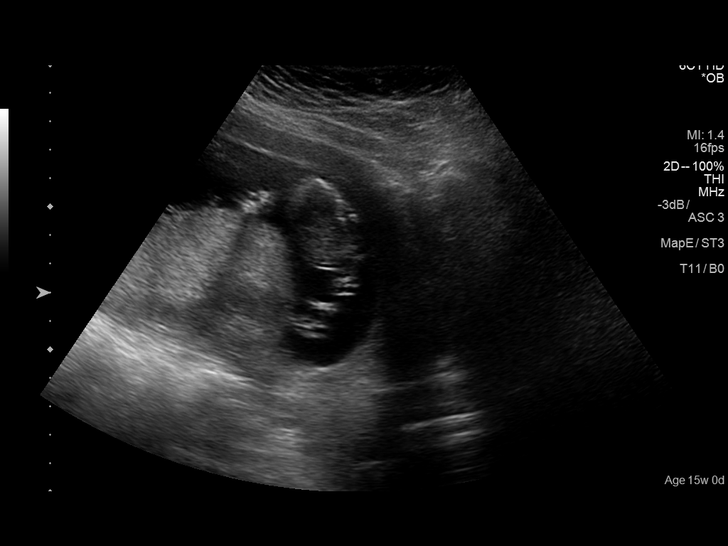
[im 34/39]
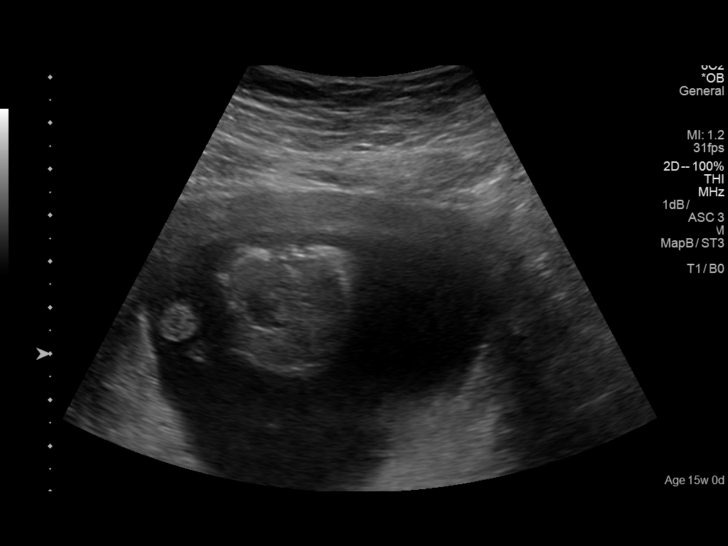
[im 37/39]
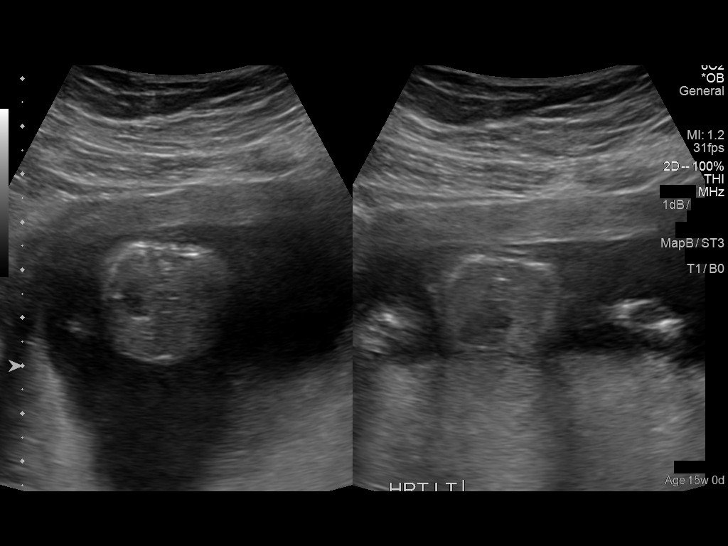

[13 of 28 positions shown; findings below may reference images not displayed]

FINDINGS: Number of Fetuses: 1

Heart Rate:  158 bpm

Movement: Yes

Presentation: Transverse

Previa: No

Placental Location: Posterior

Amniotic Fluid (Subjective): Within normal limits

Amniotic Fluid (Objective):

Vertical pocket 4.2cm

FETAL BIOMETRY

BPD:  2.9cm 15w 2d

HC:    10.8cm  15w   1d

AC:   9.4cm  15w   4d

FL:   1.6cm  14w   2d

Current Mean GA: 15w 0d              US EDC: 08/15/2016

FETAL ANATOMY

Lateral Ventricles: Not well visualized

Thalami/CSP: Not well visualized

Posterior Fossa:  Not well visualized

Nuchal Region: Not well visualized

Upper Lip: Not well visualized

Spine: Not well visualized

4 Chamber Heart on Left: Not well visualized

LVOT: Not visualized

RVOT: Not visualized

Stomach on Left: Appears normal

3 Vessel Cord: Not visualized

Cord Insertion site: Not well visualized

Kidneys: Not well visualized

Bladder: Appears normal

Extremities: Limited views appear normal

Sex: Not visualized

Technically difficult due to: Early gestational age

Maternal Findings:

Cervix:  3.1 cm transabdominally
IMPRESSION: Single living IUP measuring 15 weeks 0 days with US EDC of
08/15/2016.

Suboptimal evaluation of fetal anatomy due to early gestational age,
as above. No early gross fetal anomaly visualized. Recommend
follow-up OB ultrasound for fetal anatomic evaluation in 3 weeks.

## 2018-08-14 NOTE — L&D Delivery Note (Addendum)
Delivery Note At 1:10 PM a viable female was delivered via Vaginal, Spontaneous (Presentation: vtx;  ).  APGAR: 9, 10; weight  .   Placenta status: intact , .  Cord:3v  with the following complications:none .  Cord pH:n/a Rapid from 6 cm---> to pushing . Uncomplicated delivery of head and shoulders . Loose nuchal reduced . Vigorous female placed on abdomen  Placenta delivered 4 min later . Good hemostasis. No lacerations .  Anesthesia: cle  Episiotomy: None Lacerations: None Suture Repair: n/a Est. Blood Loss (mL):200  Mom to postpartum.  Baby to Couplet care / Skin to Skin.  Gwen Her Vasil Juhasz 07/07/2019, 1:25 PM

## 2018-09-04 IMAGING — CR DG RIBS 2V*L*
1 series · 3 of 3 positions shown · non-contrast
Comparison: Chest radiograph - 04/19/2017

CLINICAL DATA: Left posterior rib pain. Primarily involving the
eighth through tenth ribs.

EXAM:
LEFT RIBS - 2 VIEW

[Series 1: w ribs ap upper left · 0.14mm/px · 3 of 3 slices shown]
[im 1/3]
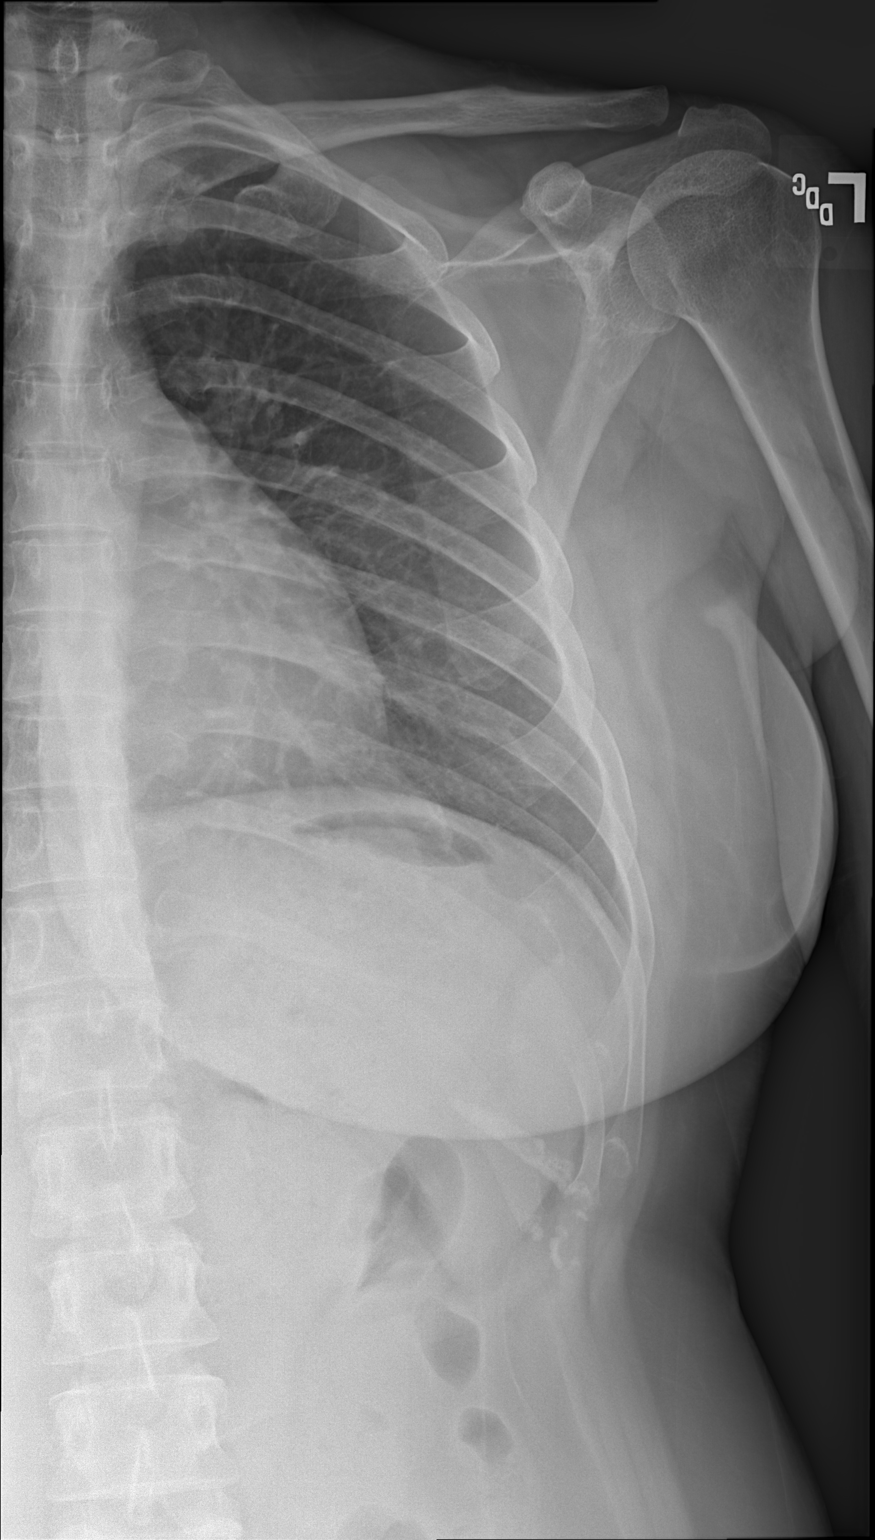
[im 2/3]
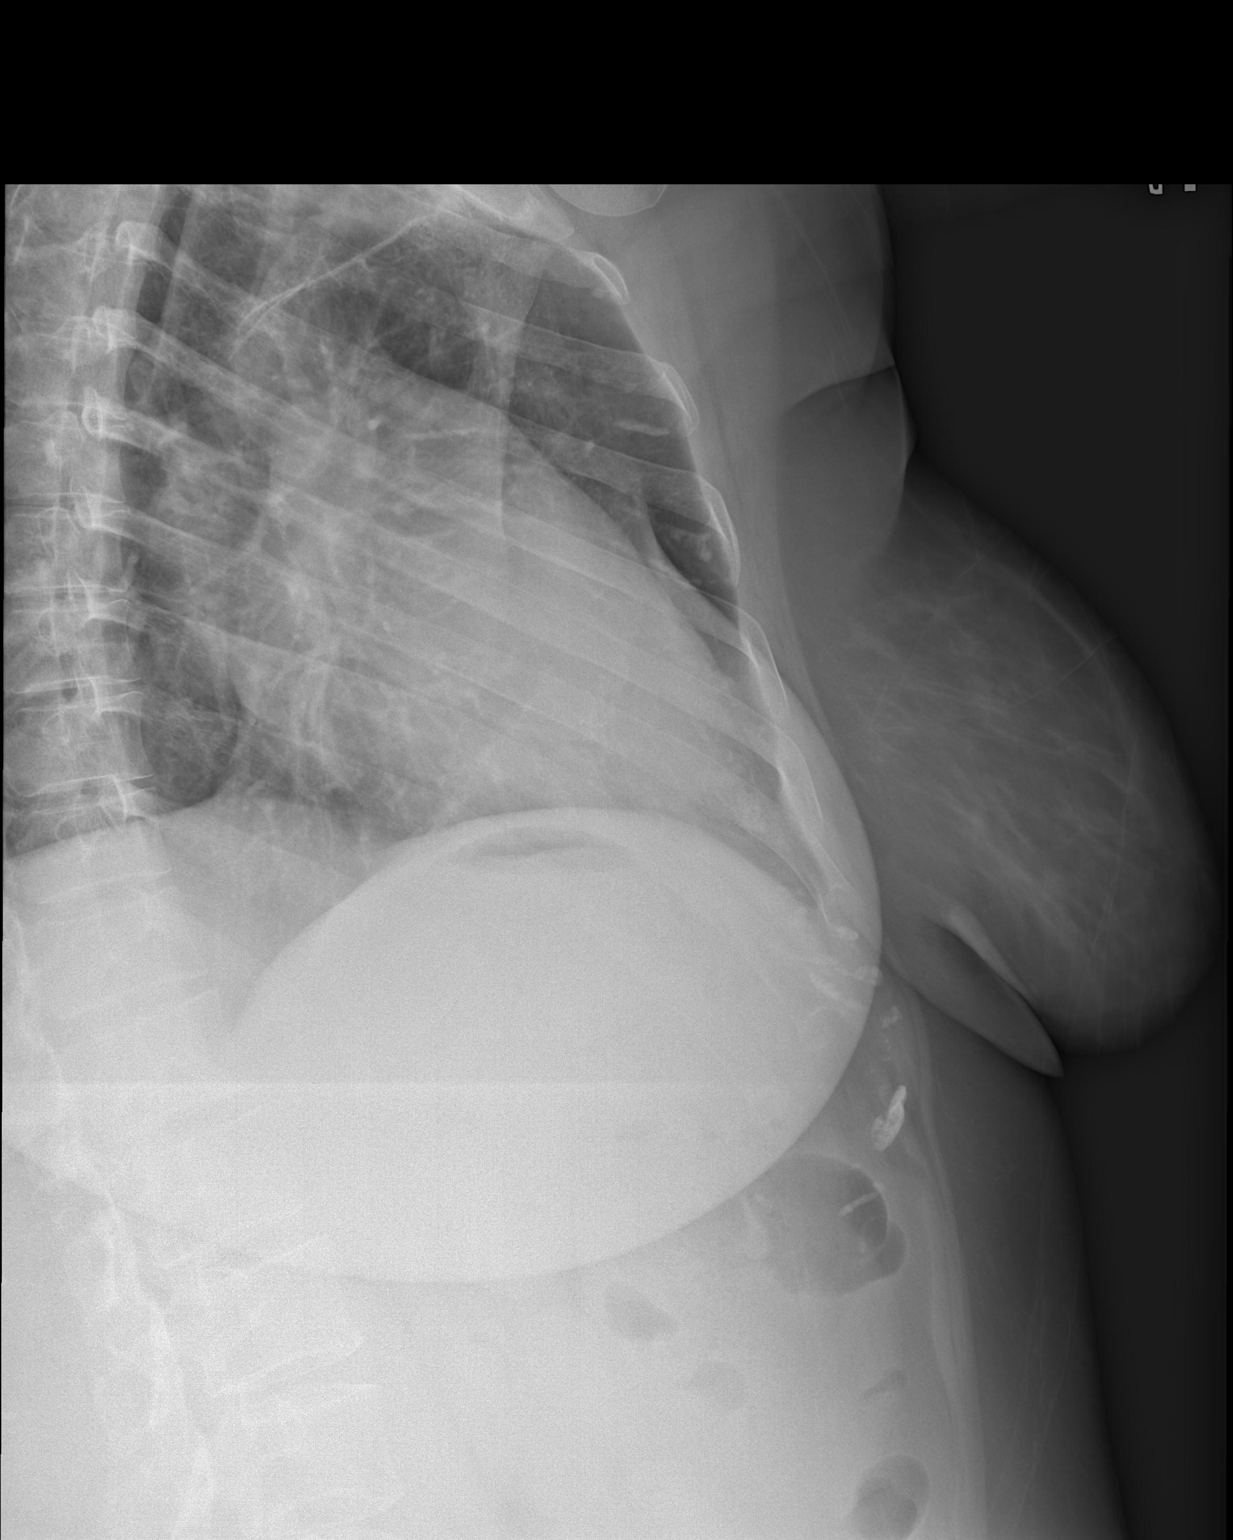
[im 3/3]
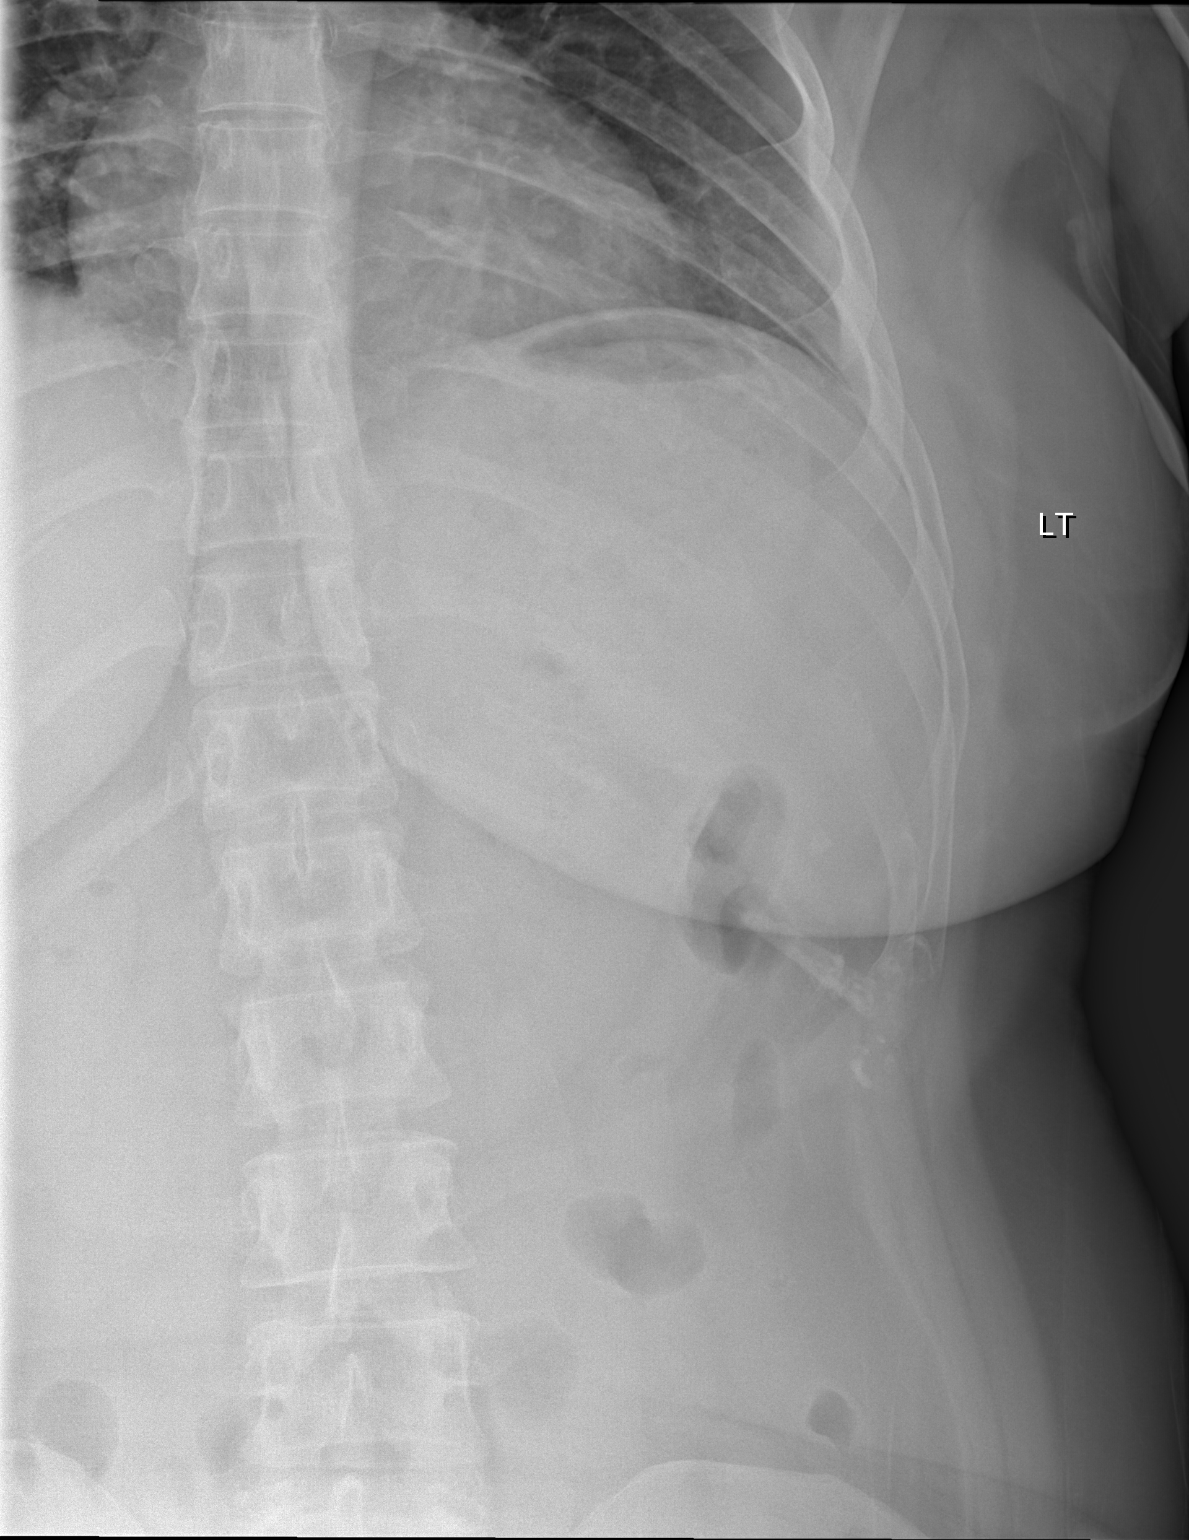

[3 of 3 positions shown; findings below may reference images not displayed]

FINDINGS: No definite displaced rib fractures with special attention paid to
the left eighth through tenth ribs. Regional soft tissues appear
normal.

Limited visualization of the adjacent thorax is normal.
IMPRESSION: No definite displaced left-sided rib fractures special attention
paid to the 8th - 10th ribs.

## 2019-01-15 LAB — OB RESULTS CONSOLE VARICELLA ZOSTER ANTIBODY, IGG
Varicella: IMMUNE
Varicella: NON-IMMUNE/NOT IMMUNE

## 2019-01-15 LAB — OB RESULTS CONSOLE HIV ANTIBODY (ROUTINE TESTING): HIV: NONREACTIVE

## 2019-01-15 LAB — OB RESULTS CONSOLE RUBELLA ANTIBODY, IGM: Rubella: IMMUNE

## 2019-01-15 LAB — OB RESULTS CONSOLE RPR: RPR: NONREACTIVE

## 2019-01-15 LAB — OB RESULTS CONSOLE HEPATITIS B SURFACE ANTIGEN: Hepatitis B Surface Ag: NEGATIVE

## 2019-01-16 ENCOUNTER — Other Ambulatory Visit: Payer: Self-pay | Admitting: Family Medicine

## 2019-01-16 DIAGNOSIS — Z3482 Encounter for supervision of other normal pregnancy, second trimester: Secondary | ICD-10-CM

## 2019-01-21 LAB — OB RESULTS CONSOLE GC/CHLAMYDIA
Chlamydia: NEGATIVE
Gonorrhea: NEGATIVE

## 2019-02-17 ENCOUNTER — Ambulatory Visit: Payer: 59

## 2019-03-06 ENCOUNTER — Other Ambulatory Visit: Payer: Self-pay

## 2019-03-06 ENCOUNTER — Ambulatory Visit
Admission: RE | Admit: 2019-03-06 | Discharge: 2019-03-06 | Disposition: A | Discharge status: Home / Self Care | Payer: 59 | Source: Ambulatory Visit | Attending: Family Medicine | Admitting: Family Medicine

## 2019-03-06 ENCOUNTER — Encounter (INDEPENDENT_AMBULATORY_CARE_PROVIDER_SITE_OTHER): Payer: Self-pay

## 2019-03-06 ENCOUNTER — Ambulatory Visit: Payer: 59

## 2019-03-06 DIAGNOSIS — Z3482 Encounter for supervision of other normal pregnancy, second trimester: Secondary | ICD-10-CM | POA: Diagnosis present

## 2019-06-05 LAB — OB RESULTS CONSOLE GBS: GBS: POSITIVE

## 2019-07-06 ENCOUNTER — Other Ambulatory Visit: Payer: Self-pay

## 2019-07-06 ENCOUNTER — Inpatient Hospital Stay
Admission: EM | Admit: 2019-07-06 | Discharge: 2019-07-08 | DRG: 807 | Disposition: A | Discharge status: Home / Self Care | Payer: 59 | Attending: Obstetrics and Gynecology | Admitting: Obstetrics and Gynecology

## 2019-07-06 DIAGNOSIS — O368190 Decreased fetal movements, unspecified trimester, not applicable or unspecified: Secondary | ICD-10-CM | POA: Diagnosis present

## 2019-07-06 DIAGNOSIS — Z3A40 40 weeks gestation of pregnancy: Secondary | ICD-10-CM

## 2019-07-06 DIAGNOSIS — O480 Post-term pregnancy: Secondary | ICD-10-CM | POA: Diagnosis present

## 2019-07-06 DIAGNOSIS — Z20828 Contact with and (suspected) exposure to other viral communicable diseases: Secondary | ICD-10-CM | POA: Diagnosis present

## 2019-07-06 DIAGNOSIS — J45909 Unspecified asthma, uncomplicated: Secondary | ICD-10-CM | POA: Diagnosis present

## 2019-07-06 DIAGNOSIS — O9952 Diseases of the respiratory system complicating childbirth: Secondary | ICD-10-CM | POA: Diagnosis present

## 2019-07-06 DIAGNOSIS — O368130 Decreased fetal movements, third trimester, not applicable or unspecified: Secondary | ICD-10-CM | POA: Diagnosis present

## 2019-07-06 NOTE — OB Triage Note (Signed)
Pt presents to L&D with c/o ctx every 4-5 min apart and rates them 5/10 on a 0-10 pain scale. Pt stated she has not felt baby move since yesterday (07/05/19). Upon applying external monitors, FHT is 135 bpm. Pt denies LOF. Vital signs WDL. Will continue to monitor.

## 2019-07-07 ENCOUNTER — Inpatient Hospital Stay: Payer: 59

## 2019-07-07 ENCOUNTER — Inpatient Hospital Stay: Payer: 59 | Admitting: Certified Registered"

## 2019-07-07 DIAGNOSIS — O368130 Decreased fetal movements, third trimester, not applicable or unspecified: Secondary | ICD-10-CM | POA: Diagnosis present

## 2019-07-07 DIAGNOSIS — O368190 Decreased fetal movements, unspecified trimester, not applicable or unspecified: Secondary | ICD-10-CM | POA: Diagnosis present

## 2019-07-07 DIAGNOSIS — Z3A40 40 weeks gestation of pregnancy: Secondary | ICD-10-CM | POA: Diagnosis not present

## 2019-07-07 DIAGNOSIS — O480 Post-term pregnancy: Secondary | ICD-10-CM | POA: Diagnosis present

## 2019-07-07 DIAGNOSIS — J45909 Unspecified asthma, uncomplicated: Secondary | ICD-10-CM | POA: Diagnosis present

## 2019-07-07 DIAGNOSIS — Z20828 Contact with and (suspected) exposure to other viral communicable diseases: Secondary | ICD-10-CM | POA: Diagnosis present

## 2019-07-07 DIAGNOSIS — O9952 Diseases of the respiratory system complicating childbirth: Secondary | ICD-10-CM | POA: Diagnosis present

## 2019-07-07 LAB — CBC
HCT: 35.7 % — ABNORMAL LOW (ref 36.0–46.0)
Hemoglobin: 11.7 g/dL — ABNORMAL LOW (ref 12.0–15.0)
MCH: 30.2 pg (ref 26.0–34.0)
MCHC: 32.8 g/dL (ref 30.0–36.0)
MCV: 92 fL (ref 80.0–100.0)
Platelets: 204 10*3/uL (ref 150–400)
RBC: 3.88 MIL/uL (ref 3.87–5.11)
RDW: 13.4 % (ref 11.5–15.5)
WBC: 7.1 10*3/uL (ref 4.0–10.5)
nRBC: 0 % (ref 0.0–0.2)

## 2019-07-07 LAB — TYPE AND SCREEN
ABO/RH(D): A POS
Antibody Screen: NEGATIVE

## 2019-07-07 LAB — RPR
RPR Ser Ql: REACTIVE — AB
RPR Titer: 1:1 {titer}

## 2019-07-07 LAB — GROUP B STREP BY PCR: Group B strep by PCR: POSITIVE — AB

## 2019-07-07 LAB — SARS CORONAVIRUS 2 BY RT PCR (HOSPITAL ORDER, PERFORMED IN ~~LOC~~ HOSPITAL LAB): SARS Coronavirus 2: NEGATIVE

## 2019-07-07 MED ORDER — OXYTOCIN BOLUS FROM INFUSION
500.0000 mL | Freq: Once | INTRAVENOUS | Status: AC
  Administered 2019-07-07: 13:00:00 500 mL via INTRAVENOUS

## 2019-07-07 MED ORDER — MEASLES, MUMPS & RUBELLA VAC IJ SOLR
0.5000 mL | Freq: Once | INTRAMUSCULAR | Status: DC
  Filled 2019-07-07: qty 0.5

## 2019-07-07 MED ORDER — PENICILLIN G 3 MILLION UNITS IVPB - SIMPLE MED
3.0000 10*6.[IU] | INTRAVENOUS | Status: DC
  Administered 2019-07-07: 09:00:00 3 10*6.[IU] via INTRAVENOUS
  Filled 2019-07-07: qty 100

## 2019-07-07 MED ORDER — FENTANYL 2.5 MCG/ML W/ROPIVACAINE 0.15% IN NS 100 ML EPIDURAL (ARMC)
EPIDURAL | Status: AC
  Filled 2019-07-07: qty 100

## 2019-07-07 MED ORDER — OXYCODONE-ACETAMINOPHEN 5-325 MG PO TABS: 2.0000 | ORAL_TABLET | ORAL | Status: DC | PRN

## 2019-07-07 MED ORDER — OXYCODONE-ACETAMINOPHEN 5-325 MG PO TABS: 1.0000 | ORAL_TABLET | ORAL | Status: DC | PRN

## 2019-07-07 MED ORDER — OXYCODONE HCL 5 MG PO TABS: 10.0000 mg | ORAL_TABLET | ORAL | Status: DC | PRN

## 2019-07-07 MED ORDER — SOD CITRATE-CITRIC ACID 500-334 MG/5ML PO SOLN: 30.0000 mL | ORAL | Status: DC | PRN

## 2019-07-07 MED ORDER — ONDANSETRON HCL 4 MG/2ML IJ SOLN: 4.0000 mg | Freq: Four times a day (QID) | INTRAMUSCULAR | Status: DC | PRN

## 2019-07-07 MED ORDER — IBUPROFEN 600 MG PO TABS
600.0000 mg | ORAL_TABLET | Freq: Four times a day (QID) | ORAL | Status: DC
  Administered 2019-07-07 – 2019-07-08 (×3): 600 mg via ORAL
  Filled 2019-07-07 (×3): qty 1

## 2019-07-07 MED ORDER — OXYTOCIN 40 UNITS IN NORMAL SALINE INFUSION - SIMPLE MED: 2.5000 [IU]/h | INTRAVENOUS | Status: DC

## 2019-07-07 MED ORDER — FENTANYL 2.5 MCG/ML W/ROPIVACAINE 0.15% IN NS 100 ML EPIDURAL (ARMC)
EPIDURAL | Status: DC | PRN
  Administered 2019-07-07: 12 mL/h via EPIDURAL

## 2019-07-07 MED ORDER — PRENATAL MULTIVITAMIN CH
1.0000 | ORAL_TABLET | Freq: Every day | ORAL | Status: DC
  Administered 2019-07-08: 12:00:00 1 via ORAL
  Filled 2019-07-07: qty 1

## 2019-07-07 MED ORDER — DIBUCAINE (PERIANAL) 1 % EX OINT: 1.0000 "application " | TOPICAL_OINTMENT | CUTANEOUS | Status: DC | PRN

## 2019-07-07 MED ORDER — SIMETHICONE 80 MG PO CHEW: 80.0000 mg | CHEWABLE_TABLET | ORAL | Status: DC | PRN

## 2019-07-07 MED ORDER — OXYCODONE HCL 5 MG PO TABS: 5.0000 mg | ORAL_TABLET | ORAL | Status: DC | PRN

## 2019-07-07 MED ORDER — SENNOSIDES-DOCUSATE SODIUM 8.6-50 MG PO TABS
2.0000 | ORAL_TABLET | ORAL | Status: DC
  Administered 2019-07-07: 23:00:00 2 via ORAL
  Filled 2019-07-07: qty 2

## 2019-07-07 MED ORDER — OXYTOCIN 40 UNITS IN NORMAL SALINE INFUSION - SIMPLE MED
1.0000 m[IU]/min | INTRAVENOUS | Status: DC
  Administered 2019-07-07: 05:00:00 2 m[IU]/min via INTRAVENOUS
  Filled 2019-07-07: qty 1000

## 2019-07-07 MED ORDER — ONDANSETRON HCL 4 MG/2ML IJ SOLN: 4.0000 mg | INTRAMUSCULAR | Status: DC | PRN

## 2019-07-07 MED ORDER — LIDOCAINE-EPINEPHRINE (PF) 1.5 %-1:200000 IJ SOLN
INTRAMUSCULAR | Status: DC | PRN
  Administered 2019-07-07: 3 mL via EPIDURAL

## 2019-07-07 MED ORDER — ACETAMINOPHEN 325 MG PO TABS: 650.0000 mg | ORAL_TABLET | ORAL | Status: DC | PRN

## 2019-07-07 MED ORDER — LIDOCAINE HCL (PF) 1 % IJ SOLN: 30.0000 mL | INTRAMUSCULAR | Status: DC | PRN

## 2019-07-07 MED ORDER — COCONUT OIL OIL
1.0000 "application " | TOPICAL_OIL | Status: DC | PRN
  Administered 2019-07-08: 1 via TOPICAL
  Filled 2019-07-07: qty 120

## 2019-07-07 MED ORDER — ZOLPIDEM TARTRATE 5 MG PO TABS: 5.0000 mg | ORAL_TABLET | Freq: Every evening | ORAL | Status: DC | PRN

## 2019-07-07 MED ORDER — LACTATED RINGERS IV SOLN
INTRAVENOUS | Status: DC
  Administered 2019-07-07 (×2): via INTRAVENOUS

## 2019-07-07 MED ORDER — BUPIVACAINE HCL (PF) 0.25 % IJ SOLN
INTRAMUSCULAR | Status: DC | PRN
  Administered 2019-07-07: 5 mL via EPIDURAL
  Administered 2019-07-07: 4 mL via EPIDURAL

## 2019-07-07 MED ORDER — LACTATED RINGERS IV SOLN
500.0000 mL | INTRAVENOUS | Status: DC | PRN
  Administered 2019-07-07: 11:00:00 500 mL via INTRAVENOUS

## 2019-07-07 MED ORDER — FERROUS SULFATE 325 (65 FE) MG PO TABS
325.0000 mg | ORAL_TABLET | Freq: Two times a day (BID) | ORAL | Status: DC
  Administered 2019-07-07 – 2019-07-08 (×2): 325 mg via ORAL
  Filled 2019-07-07 (×2): qty 1

## 2019-07-07 MED ORDER — BENZOCAINE-MENTHOL 20-0.5 % EX AERO
1.0000 "application " | INHALATION_SPRAY | CUTANEOUS | Status: DC | PRN
  Administered 2019-07-08: 1 via TOPICAL
  Filled 2019-07-07: qty 56

## 2019-07-07 MED ORDER — SODIUM CHLORIDE 0.9 % IV SOLN
5.0000 10*6.[IU] | Freq: Once | INTRAVENOUS | Status: AC
  Administered 2019-07-07: 05:00:00 5 10*6.[IU] via INTRAVENOUS
  Filled 2019-07-07: qty 5

## 2019-07-07 MED ORDER — DIPHENHYDRAMINE HCL 25 MG PO CAPS: 25.0000 mg | ORAL_CAPSULE | Freq: Four times a day (QID) | ORAL | Status: DC | PRN

## 2019-07-07 MED ORDER — LIDOCAINE HCL (PF) 1 % IJ SOLN
INTRAMUSCULAR | Status: DC | PRN
  Administered 2019-07-07: 3 mL via INTRADERMAL

## 2019-07-07 MED ORDER — ONDANSETRON HCL 4 MG PO TABS: 4.0000 mg | ORAL_TABLET | ORAL | Status: DC | PRN

## 2019-07-07 MED ORDER — IBUPROFEN 600 MG PO TABS
600.0000 mg | ORAL_TABLET | Freq: Four times a day (QID) | ORAL | Status: DC
  Administered 2019-07-07: 18:00:00 600 mg via ORAL
  Filled 2019-07-07: qty 1

## 2019-07-07 MED ORDER — TERBUTALINE SULFATE 1 MG/ML IJ SOLN: 0.2500 mg | Freq: Once | INTRAMUSCULAR | Status: DC | PRN

## 2019-07-07 MED ORDER — WITCH HAZEL-GLYCERIN EX PADS: 1.0000 "application " | MEDICATED_PAD | CUTANEOUS | Status: DC | PRN

## 2019-07-07 MED ORDER — ACETAMINOPHEN 325 MG PO TABS
650.0000 mg | ORAL_TABLET | ORAL | Status: DC | PRN
  Administered 2019-07-08: 12:00:00 650 mg via ORAL
  Filled 2019-07-07: qty 2

## 2019-07-07 MED ORDER — MAGNESIUM HYDROXIDE 400 MG/5ML PO SUSP: 30.0000 mL | ORAL | Status: DC | PRN

## 2019-07-07 NOTE — Anesthesia Preprocedure Evaluation (Signed)
Anesthesia Evaluation  Patient identified by MRN, date of birth, ID band Patient awake    Reviewed: Allergy & Precautions, H&P , NPO status , Patient's Chart, lab work & pertinent test results  Airway Mallampati: III  TM Distance: >3 FB Neck ROM: full    Dental  (+) Dental Advidsory Given, Chipped   Pulmonary asthma ,           Cardiovascular negative cardio ROS       Neuro/Psych negative neurological ROS  negative psych ROS   GI/Hepatic Neg liver ROS, GERD  ,  Endo/Other  negative endocrine ROS  Renal/GU negative Renal ROS  negative genitourinary   Musculoskeletal   Abdominal   Peds  Hematology   Anesthesia Other Findings   Reproductive/Obstetrics (+) Pregnancy                             Anesthesia Physical Anesthesia Plan  ASA: II  Anesthesia Plan: Epidural   Post-op Pain Management:    Induction:   PONV Risk Score and Plan:   Airway Management Planned:   Additional Equipment:   Intra-op Plan:   Post-operative Plan:   Informed Consent: I have reviewed the patients History and Physical, chart, labs and discussed the procedure including the risks, benefits and alternatives for the proposed anesthesia with the patient or authorized representative who has indicated his/her understanding and acceptance.       Plan Discussed with: CRNA  Anesthesia Plan Comments:         Anesthesia Quick Evaluation

## 2019-07-07 NOTE — H&P (Signed)
HISTORY AND PHYSICAL  HISTORY OF PRESENT ILLNESS: Ms. Katie Tate is a [age] y.o. [phone] at 40.3W by LMP with a pregnancy complicated by elderly multigravida, and mild asthma presenting for induction of labor for post-dates pregnancy.   She has been having contractions irregularly and denies leakage of fluid, or vaginal bleeding. She came in for decreased fetal movement - NST reactive, AFI decreased    REVIEW OF SYSTEMS: A complete review of systems was performed and was specifically negative for headache, changes in vision, RUQ pain, shortness of breath, chest pain, lower extremity edema and dysuria.   HISTORY:  Past Medical History:  Diagnosis Date  . Advanced maternal age in multigravida 2017  . Asthma     Past Surgical History:  Procedure Laterality Date  . NO PAST SURGERIES      No current facility-administered medications on file prior to encounter.    Current Outpatient Medications on File Prior to Encounter  Medication Sig Dispense Refill  . Prenatal Vit-Fe Fumarate-FA (PRENATAL MULTIVITAMIN) TABS tablet Take 1 tablet by mouth daily at 12 noon.       No Known Allergies  OB History  Gravida Para Term Preterm AB Living  4 3 3     3   SAB TAB Ectopic Multiple Live Births        0 3    # Outcome Date GA Lbr Len/2nd Weight Sex Delivery Anes PTL Lv  4 Current           3 Term 08/22/16 [redacted]w[redacted]d / 01:15 3240 g M Vag-Spont EPI  LIV  2 Term 09/20/04    F Vag-Spont   LIV  1 Term 05/27/99    M Vag-Spont   LIV    Gynecologic History: History of Abnormal Pap Smear: no History of STI: no  Social History   Tobacco Use  . Smoking status: Never Smoker  . Smokeless tobacco: Never Used  Substance Use Topics  . Alcohol use: No  . Drug use: No    PHYSICAL EXAM: Temp:  [97.6 F (36.4 C)] 97.6 F (36.4 C) (11/22 2312) Pulse Rate:  [93-95] 93 (11/22 2312) Resp:  [16] 16 (11/22 2312) BP: (127)/(72) 127/72 (11/22 2312) Weight:  [81.2 kg] 81.2 kg (11/22 2312)  GENERAL:  NAD AAOx3 CHEST:CTAB no increased work of breathing CV:RRR  ABDOMEN: gravid, nontender EXTREMITIES:  Warm and well-perfused, nontender, nonedematous CERVIX: 3/ 40/ -2  FHT:130s baseline with mod. variability pos accelerations and no decelerations  Toco: irregular UCs  PRENATAL STUDIES:  Prenatal Labs: Blood type/Rh A pos  Antibody screen neg  Rubella Immune  Varicella Immune  RPR NR  HBsAg Neg  HIV NR  GC neg  Chlamydia neg  Genetic screening declined  1 hour GTT 91  3 hour GTT n/a  GBS pos   Last 02-20-2003 07/07/19  ASSESSMENT AND PLAN: Katie Tate is a 38 y.o. [phone] female at 40.3 weeks here for IOL for post-dates pregnancy.  1. Admit to Labor & Delivery; consents reviewed and obtained   2. Induction of Labor:  -  Bishop score: 6 -  Pelvis proven to 7#2 -  Plan for induction with Pitocin - Presentation: vtx confirmed by [facility]  -  Maternal pain control as desired: Epidural prn - Anticipate vaginal delivery  2. Fetal Well being  -  Plan for continuous fetal monitoring  - Ultrasound:  reviewed, as above - Group B Streptococcus: pos  3. Routine OB: - Prenatal labs reviewed, as above - Rh pos -  CBC & T&S on admit - Clear fluids, IVF  4. Post Partum Planning: - Infant feeding: Breast and bottle - Contraception: IUD  Katie Tate, CNM 07/07/2019 1:40 AM

## 2019-07-07 NOTE — Lactation Note (Addendum)
This note was copied from a baby's chart. Lactation Consultation Note  Patient Name: Katie Tate [id number] Date: 07/07/2019   Assisted mom with first breast feed in [facility].  Mom has slightly flat, hard nipples, but can be compress enough to obtain latch.  Demonstrated how to hand express colostrum to entice Katie Tate to latch.  Mom did feel slight discomfort when hand expressed.  Originally we tried to [employer] in [place] [place], but mom struggled to control his head for him to maintain the latch and he was on and off the breast.  He was initially curling in lower lip which also affected latch.  Demonstrated how to gently touch chin to flip out lower lip.  We switched him to football hold on the left breast with better pillow support skin to skin and he latched better and sustained the latch for long interval.  Initially mom kept pulling back on the breast to allow breathing room which caused him to slip back to tip of nipple interfering with good deep latch.  Demonstrated how to sandwich breast and push down toward his mouth instead of pulling back.  Mom could feel strong tugs on the breast once he achieved more adequate latch.  Mom breast fed her other 3 children who are 66 years old, 17 years old and 38 years old.  She breast fed her 38 year old the longes for 9 months.  Mom has [employer].  Discussed process of getting DEBP through insurance.  Reviewed normal newborn stomach size, feeding cues, supply and demand, normal course of lactation and routine newborn feeding patterns.  Encouraged mom to call whenever she needed assistance with breast feeding.  Maternal Data    Feeding Feeding Type: Breast Fed  LATCH Score                   Interventions    Lactation Tools Discussed/Used     Consult Status      Jarold Motto 07/07/2019, 5:43 PM

## 2019-07-07 NOTE — Progress Notes (Signed)
Brigetta Natacha Jepsen is a 38 y.o. [phone] at [redacted]w[redacted]d by  Subjective: Spontaneous decels , resolved   Objective: BP 125/71 (BP Location: Right Arm)   Pulse 91   Temp 97.6 F (36.4 C) (Oral)   Resp 15   Ht 5\' 3"  (1.6 m)   Wt 81.2 kg   LMP 09/27/2018 (Approximate)   BMI 31.71 kg/m  No intake/output data recorded. No intake/output data recorded.  FHT:  FHR: 130 bpm, variability: moderate,  accelerations:  Present,  decelerations:  Absent UC:   regular, every 2-3 minutes SVE:   5/75/0/ vtx  Labs: Lab Results  Component Value Date   WBC 7.1 07/07/2019   HGB 11.7 (L) 07/07/2019   HCT 35.7 (L) 07/07/2019   MCV 92.0 07/07/2019   PLT 204 07/07/2019    Assessment / Plan: Spontaneous labor, progressing normally decel- resolved , reassuring fetal monitoring    Gwen Her Schermerhorn 07/07/2019, 9:29 AM

## 2019-07-07 NOTE — Discharge Summary (Signed)
Obstetrical Discharge Summary  Patient Name: Katie Tate DOB: 10/29/1980 MRN: [id number]  Date of Admission: 07/06/2019 Date of Delivery: 07/07/2019 Delivered by: Beverly Gust MD Date of Discharge: 07/08/2019 Primary OB:  Phineas Real [id number]'D last menstrual period was 09/27/2018 (approximate). EDC Estimated Date of Delivery: 07/04/19 Gestational Age at Delivery: [redacted]w[redacted]d   Antepartum complications: asthma Admitting Diagnosis: labor Secondary Diagnosis: Patient Active Problem List   Diagnosis Date Noted  . Decreased fetal movement 07/07/2019  . Post-dates pregnancy 07/07/2019  . Postpartum care following vaginal delivery 08/23/2016  . Acute blood loss anemia 08/23/2016  . Labor and delivery, indication for care 08/21/2016  . Irregular contractions 08/16/2016  . Advanced maternal age, primigravida in second trimester, antepartum 02/24/2016    Augmentation: AROM and Pitocin Complications: None Intrapartum complications/course: +gbs with adequate tx  Date of Delivery: 07/08/2019  Delivered By: Beverly Gust MD Delivery Type: spontaneous vaginal delivery Anesthesia: epidural Placenta: spontaneous Laceration: none Episiotomy: none Newborn Data: Live born female  Birth Weight: 2970g 6lb 8.8oz APGAR: 9, 10  Newborn Delivery   Birth date/time: 07/07/2019 13:10:00 Delivery type: Vaginal, Spontaneous      Postpartum Procedures: none  Post partum course:  Patient had an uncomplicated postpartum course.  By time of discharge on PPD#1, her pain was controlled on oral pain medications; she had appropriate lochia and was ambulating, voiding without difficulty and tolerating regular diet.  She was deemed stable for discharge to home.     Discharge Physical Exam:  BP 102/88 (BP Location: Right Arm)   Pulse 96   Temp 98.1 F (36.7 C) (Oral)   Resp 18   Ht 5\' 3"  (1.6 m)   Wt 81.2 kg   LMP 09/27/2018 (Approximate)   SpO2 94%   Breastfeeding Unknown   BMI  31.71 kg/m   General: NAD CV: RRR Pulm: CTABL, nl effort ABD: s/nd/nt, fundus firm and below the umbilicus Lochia: moderate DVT Evaluation: LE non-ttp, no evidence of DVT on exam.  Hemoglobin  Date Value Ref Range Status  07/08/2019 11.1 (L) 12.0 - 15.0 g/dL Final   HGB  Date Value Ref Range Status  10/03/2013 13.4 12.0 - 16.0 g/dL Final   HCT  Date Value Ref Range Status  07/08/2019 31.9 (L) 36.0 - 46.0 % Final  10/03/2013 38.4 35.0 - 47.0 % Final     Disposition: stable, discharge to home. Baby Feeding: breastmilk & formula Baby Disposition: home with mom  Rh Immune globulin given: n/a Rubella vaccine given: n/a Flu vaccine given in AP or PP setting: offered prior to d/c  Contraception: TBD  Prenatal Labs:  Blood type/Rh A pos  Antibody screen neg  Rubella Immune  Varicella Non-Immune  RPR NR  HBsAg Neg  HIV NR  GC neg  Chlamydia neg  Genetic screening declined  1 hour GTT 91  3 hour GTT n/a  GBS pos      Plan:  Katie Tate was discharged to home in good condition. Follow-up appointment with delivering provider in 6 weeks.  Discharge Medications: Allergies as of 07/08/2019   No Known Allergies     Medication List    TAKE these medications   acetaminophen 325 MG tablet Commonly known as: Tylenol Take 2 tablets (650 mg total) by mouth every 4 (four) hours as needed for mild pain or moderate pain.   ibuprofen 600 MG tablet Commonly known as: ADVIL Take 1 tablet (600 mg total) by mouth every 6 (six) hours as needed for mild pain, moderate pain  or cramping.   prenatal multivitamin Tabs tablet Take 1 tablet by mouth daily at 12 noon.       Follow-up Information    Schermerhorn, Gwen Her, MD. Schedule an appointment as soon as possible for a visit in 6 week(s).   Specialty: Obstetrics and Gynecology Why: For postpartum visit Contact information: [address] [place]  [address] [phone]           Signed: Lisette Grinder, CNM 07/08/2019 12:26 PM

## 2019-07-07 NOTE — Anesthesia Procedure Notes (Signed)
Epidural Patient location during procedure: OB  Staffing Anesthesiologist: Molli Barrows, MD Resident/CRNA: Rolla Plate, CRNA Performed: resident/CRNA   Preanesthetic Checklist Completed: patient identified, site marked, surgical consent, pre-op evaluation, timeout performed, IV checked, risks and benefits discussed and monitors and equipment checked  Epidural Patient position: sitting Prep: ChloraPrep and site prepped and draped Patient monitoring: heart rate, continuous pulse ox and blood pressure Approach: midline Location: L4-L5 Injection technique: LOR saline  Needle:  Needle type: Tuohy  Needle gauge: 17 G Needle length: 9 cm Needle insertion depth: 8 cm Catheter type: closed end flexible Catheter size: 19 Gauge Catheter at skin depth: 13 cm Test dose: negative and 1.5% lidocaine with Epi 1:200 K  Assessment Events: blood not aspirated, injection not painful, no injection resistance, negative IV test and no paresthesia  Additional Notes   Patient tolerated the insertion well without complications.Reason for block:procedure for pain

## 2019-07-07 NOTE — H&P (Signed)
H+P already done by jennifer oxley , CNM on 07/07/19 Please remove from my basket

## 2019-07-08 LAB — CBC
HCT: 31.9 % — ABNORMAL LOW (ref 36.0–46.0)
Hemoglobin: 11.1 g/dL — ABNORMAL LOW (ref 12.0–15.0)
MCH: 30.9 pg (ref 26.0–34.0)
MCHC: 34.8 g/dL (ref 30.0–36.0)
MCV: 88.9 fL (ref 80.0–100.0)
Platelets: 170 10*3/uL (ref 150–400)
RBC: 3.59 MIL/uL — ABNORMAL LOW (ref 3.87–5.11)
RDW: 13.2 % (ref 11.5–15.5)
WBC: 8.5 10*3/uL (ref 4.0–10.5)
nRBC: 0 % (ref 0.0–0.2)

## 2019-07-08 LAB — T.PALLIDUM AB, TOTAL: T Pallidum Abs: NONREACTIVE

## 2019-07-08 MED ORDER — VARICELLA VIRUS VACCINE LIVE 1350 PFU/0.5ML IJ SUSR
0.5000 mL | Freq: Once | INTRAMUSCULAR | Status: AC
  Administered 2019-07-08: 0.5 mL via SUBCUTANEOUS
  Filled 2019-07-08 (×2): qty 0.5

## 2019-07-08 MED ORDER — INFLUENZA VAC SPLIT QUAD 0.5 ML IM SUSY: 0.5000 mL | PREFILLED_SYRINGE | INTRAMUSCULAR | Status: DC

## 2019-07-08 MED ORDER — IBUPROFEN 600 MG PO TABS: 600.0000 mg | ORAL_TABLET | Freq: Four times a day (QID) | ORAL | 0 refills | Status: AC | PRN

## 2019-07-08 MED ORDER — ACETAMINOPHEN 325 MG PO TABS: 650.0000 mg | ORAL_TABLET | ORAL | Status: AC | PRN

## 2019-07-08 NOTE — Progress Notes (Signed)
Pt reports that she received Flu vaccine at her 38 wk OB appointment.

## 2019-07-08 NOTE — Lactation Note (Signed)
This note was copied from a baby's chart. Lactation Consultation Note  Patient Name: Katie Tate [id number] Date: 07/08/2019 Reason for consult: Follow-up assessment  LC intern discussed baby tummy sizes and ages for typical cluster feeding, hunger cues and signs of content, pace bottle feeding and P&P diapers.  Advised mom of breast care (colustrum and/or coconut oil), what to expect when mature milk is in, when to be concerned (engorgment), importance of getting good rest and asking for help, when needed.  Advised parents of number to call with any concerns  Maternal Data Formula Feeding for Exclusion: No Has patient been taught Hand Expression?: Yes Does the patient have breastfeeding experience prior to this delivery?: Yes  Feeding    LATCH Score                   Interventions Interventions: Breast feeding basics reviewed  Lactation Tools Discussed/Used     Consult Status Consult Status: Complete Date: 07/08/19 Follow-up type: Call as needed    Lavonia Drafts 07/08/2019, 11:47 AM

## 2019-07-08 NOTE — Anesthesia Postprocedure Evaluation (Signed)
Anesthesia Post Note  Patient: Tanae Petrosky  Procedure(s) Performed: AN AD [place]  Patient location during evaluation: Mother Baby Anesthesia Type: Epidural Level of consciousness: awake and alert Pain management: pain level controlled Vital Signs Assessment: post-procedure vital signs reviewed and stable Respiratory status: spontaneous breathing, nonlabored ventilation and respiratory function stable Cardiovascular status: stable Postop Assessment: no headache, no backache and epidural receding Anesthetic complications: no     Last Vitals:  Vitals:   07/08/19 0306 07/08/19 0809  BP: (!) 96/59 102/88  Pulse: 76 96  Resp: 18 18  Temp: 37.1 C 36.7 C  SpO2: 99% 94%    Last Pain:  Vitals:   07/08/19 0809  TempSrc: Oral  PainSc:                  Jerrye Noble

## 2019-07-08 NOTE — Discharge Instructions (Signed)

## 2019-07-08 NOTE — Progress Notes (Signed)
Dc to home.  Pt verb u/o of care for self and f/u care.
